# Patient Record
Sex: Male | Born: 1950
Health system: Southern US, Community
[De-identification: ages and names within clinical notes are randomized; demographics above are authoritative.]

## PROBLEM LIST (undated history)

## (undated) DIAGNOSIS — S46219A Strain of muscle, fascia and tendon of other parts of biceps, unspecified arm, initial encounter: Secondary | ICD-10-CM

## (undated) DIAGNOSIS — M2042 Other hammer toe(s) (acquired), left foot: Secondary | ICD-10-CM

## (undated) DIAGNOSIS — K219 Gastro-esophageal reflux disease without esophagitis: Secondary | ICD-10-CM

## (undated) DIAGNOSIS — F419 Anxiety disorder, unspecified: Secondary | ICD-10-CM

## (undated) DIAGNOSIS — I10 Essential (primary) hypertension: Secondary | ICD-10-CM

## (undated) HISTORY — PX: TONSILLECTOMY AND ADENOIDECTOMY: SUR1326

## (undated) HISTORY — PX: OTHER SURGICAL HISTORY: SHX169

---

## 1998-03-29 ENCOUNTER — Emergency Department (HOSPITAL_COMMUNITY): Admission: EM | Admit: 1998-03-29 | Discharge: 1998-03-29 | Payer: Self-pay | Admitting: Emergency Medicine

## 1998-03-31 ENCOUNTER — Encounter: Admission: RE | Admit: 1998-03-31 | Discharge: 1998-06-29 | Payer: Self-pay | Admitting: Internal Medicine

## 1998-08-09 ENCOUNTER — Emergency Department (HOSPITAL_COMMUNITY): Admission: EM | Admit: 1998-08-09 | Discharge: 1998-08-09 | Payer: Self-pay | Admitting: Emergency Medicine

## 2001-12-31 ENCOUNTER — Encounter: Admission: RE | Admit: 2001-12-31 | Discharge: 2001-12-31 | Payer: Self-pay | Admitting: Family Medicine

## 2001-12-31 ENCOUNTER — Encounter: Payer: Self-pay | Admitting: Family Medicine

## 2004-02-15 ENCOUNTER — Emergency Department (HOSPITAL_COMMUNITY): Admission: EM | Admit: 2004-02-15 | Discharge: 2004-02-15 | Payer: Self-pay | Admitting: Emergency Medicine

## 2009-02-11 ENCOUNTER — Emergency Department (HOSPITAL_COMMUNITY): Admission: EM | Admit: 2009-02-11 | Discharge: 2009-02-12 | Payer: Self-pay | Admitting: Emergency Medicine

## 2009-02-17 ENCOUNTER — Encounter: Admission: RE | Admit: 2009-02-17 | Discharge: 2009-02-17 | Payer: Self-pay | Admitting: Family Medicine

## 2009-11-29 ENCOUNTER — Emergency Department (HOSPITAL_COMMUNITY): Admission: EM | Admit: 2009-11-29 | Discharge: 2009-11-29 | Payer: Self-pay | Admitting: Emergency Medicine

## 2009-12-08 ENCOUNTER — Ambulatory Visit (HOSPITAL_BASED_OUTPATIENT_CLINIC_OR_DEPARTMENT_OTHER): Admission: RE | Admit: 2009-12-08 | Discharge: 2009-12-08 | Payer: Self-pay | Admitting: Unknown Physician Specialty

## 2009-12-08 HISTORY — PX: ORIF ANKLE FRACTURE: SUR919

## 2010-11-22 LAB — BASIC METABOLIC PANEL
BUN: 20 mg/dL (ref 6–23)
CO2: 28 mEq/L (ref 19–32)
Calcium: 9.2 mg/dL (ref 8.4–10.5)
Chloride: 106 mEq/L (ref 96–112)
Creatinine, Ser: 1.01 mg/dL (ref 0.4–1.5)
GFR calc Af Amer: >60 mL/min (ref >60)
GFR calc non Af Amer: >60 mL/min (ref >60)
Glucose, Bld: 109 mg/dL — ABNORMAL HIGH (ref 70–99)
Potassium: 4.5 mEq/L (ref 3.5–5.1)
Sodium: 137 mEq/L (ref 135–145)

## 2010-11-22 LAB — POCT HEMOGLOBIN-HEMACUE: Hemoglobin: 14.8 g/dL (ref 13.0–17.0)

## 2010-12-11 LAB — URINALYSIS, ROUTINE W REFLEX MICROSCOPIC
Bilirubin Urine: NEGATIVE
Ketones, ur: 15 mg/dL — AB
Nitrite: NEGATIVE
pH: 7 (ref 5.0–8.0)

## 2010-12-11 LAB — CBC
HCT: 43.4 % (ref 39.0–52.0)
Hemoglobin: 14.9 g/dL (ref 13.0–17.0)
MCHC: 34.3 g/dL (ref 30.0–36.0)
MCV: 88.6 fL (ref 78.0–100.0)
RDW: 13.6 % (ref 11.5–15.5)

## 2010-12-11 LAB — BASIC METABOLIC PANEL
BUN: 21 mg/dL (ref 6–23)
CO2: 25 mEq/L (ref 19–32)
Calcium: 8.7 mg/dL (ref 8.4–10.5)
Chloride: 106 mEq/L (ref 96–112)
Creatinine, Ser: 1.1 mg/dL (ref 0.4–1.5)
GFR calc Af Amer: >60 mL/min (ref >60)
GFR calc non Af Amer: >60 mL/min (ref >60)
Glucose, Bld: 133 mg/dL — ABNORMAL HIGH (ref 70–99)
Potassium: 4.4 mEq/L (ref 3.5–5.1)
Sodium: 140 mEq/L (ref 135–145)

## 2010-12-11 LAB — DIFFERENTIAL
Basophils Absolute: 0 10*3/uL (ref 0.0–0.1)
Basophils Relative: 0 % (ref 0–1)
Eosinophils Absolute: 0 10*3/uL (ref 0.0–0.7)
Eosinophils Relative: 0 % (ref 0–5)
Lymphocytes Relative: 7 % — ABNORMAL LOW (ref 12–46)
Monocytes Absolute: 0.5 10*3/uL (ref 0.1–1.0)

## 2011-07-22 ENCOUNTER — Emergency Department (HOSPITAL_COMMUNITY)
Admission: EM | Admit: 2011-07-22 | Discharge: 2011-07-22 | Disposition: A | Discharge status: Home / Self Care | Payer: Managed Care, Other (non HMO) | Attending: Emergency Medicine | Admitting: Emergency Medicine

## 2011-07-22 ENCOUNTER — Emergency Department (HOSPITAL_COMMUNITY): Payer: Managed Care, Other (non HMO)

## 2011-07-22 DIAGNOSIS — I10 Essential (primary) hypertension: Secondary | ICD-10-CM | POA: Insufficient documentation

## 2011-07-22 DIAGNOSIS — IMO0002 Reserved for concepts with insufficient information to code with codable children: Secondary | ICD-10-CM | POA: Insufficient documentation

## 2011-07-22 DIAGNOSIS — X500XXA Overexertion from strenuous movement or load, initial encounter: Secondary | ICD-10-CM | POA: Insufficient documentation

## 2011-07-22 DIAGNOSIS — M79609 Pain in unspecified limb: Secondary | ICD-10-CM | POA: Insufficient documentation

## 2011-07-22 DIAGNOSIS — S63509A Unspecified sprain of unspecified wrist, initial encounter: Secondary | ICD-10-CM

## 2011-07-22 HISTORY — DX: Essential (primary) hypertension: I10

## 2011-07-22 MED ORDER — OXYCODONE-ACETAMINOPHEN 5-325 MG PO TABS: 1.0000 | ORAL_TABLET | Freq: Four times a day (QID) | ORAL | Status: AC | PRN

## 2011-07-22 MED ORDER — NAPROXEN 500 MG PO TABS: 500.0000 mg | ORAL_TABLET | Freq: Two times a day (BID) | ORAL | Status: DC

## 2011-07-22 NOTE — ED Notes (Signed)
Pt amb indep out of facility.

## 2011-07-22 NOTE — ED Provider Notes (Signed)
History     CSN: [id number] Arrival date & time: 07/22/2011  7:19 AM   First MD Initiated Contact with Patient 07/22/11 0745      Chief Complaint  Patient presents with  . Arm Pain    pt in this am with pain to the left arm states while lifting something last night felt a "pop" in the left arm states pain has increased this am states pain radiaites to the wrist no swelling noted pulses strong     (Consider location/radiation/quality/duration/timing/severity/associated sxs/prior treatment) HPI Comments: Patient states he was lifting a heavy table and felt a "pop" in the left arm. States he's had constant pain since.  Patient is a 60 y.o. male presenting with arm pain. The history is provided by the patient. No language interpreter was used.  Arm Pain This is a new problem. The current episode started yesterday. The problem occurs constantly. The problem has not changed since onset.Pertinent negatives include no chest pain, no abdominal pain, no headaches and no shortness of breath. The symptoms are aggravated by bending. The symptoms are relieved by nothing. He has tried acetaminophen, rest and a cold compress (bengay and nsaids) for the symptoms. The treatment provided no relief.    Past Medical History  Diagnosis Date  . Hypertension     Past Surgical History  Procedure Date  . Ankle fracture surgery     History reviewed. No pertinent family history.  History  Substance Use Topics  . Smoking status: Never Smoker   . Smokeless tobacco: Not on file  . Alcohol Use: No      Review of Systems  Constitutional: Negative for fever, activity change and fatigue.  HENT: Negative for congestion, sore throat, rhinorrhea and neck pain.   Respiratory: Negative for cough and shortness of breath.   Cardiovascular: Negative for chest pain and palpitations.  Gastrointestinal: Negative for nausea, vomiting and abdominal pain.  Genitourinary: Negative for dysuria, urgency, frequency and  flank pain.  Musculoskeletal: Positive for arthralgias. Negative for back pain and joint swelling.  Neurological: Negative for dizziness, weakness, light-headedness and headaches.  All other systems reviewed and are negative.    Allergies  Review of patient's allergies indicates no known allergies.  Home Medications   Current Outpatient Rx  Name Route Sig Dispense Refill  . VITAMIN D 1000 UNITS PO TABS Oral Take 1,000 Units by mouth daily.      Marland Kitchen DILTIAZEM HCL COATED BEADS 120 MG PO CP24 Oral Take 120 mg by mouth daily.      Carma Leaven M PLUS PO TABS Oral Take 1 tablet by mouth daily.      . QUINAPRIL HCL 40 MG PO TABS Oral Take 40 mg by mouth daily.      Marland Kitchen NAPROXEN 500 MG PO TABS Oral Take 1 tablet (500 mg total) by mouth 2 (two) times daily. 30 tablet 0  . OXYCODONE-ACETAMINOPHEN 5-325 MG PO TABS Oral Take 1-2 tablets by mouth every 6 (six) hours as needed for pain. 20 tablet 0    BP 203/87  Pulse 70  Temp(Src) 97.7 F (36.5 C) (Oral)  Resp 16  SpO2 100%  Physical Exam  Nursing note and vitals reviewed. Constitutional: He is oriented to person, place, and time. He appears well-developed and well-nourished. No distress.  HENT:  Head: Normocephalic and atraumatic.  Mouth/Throat: Oropharynx is clear and moist.  Eyes: Conjunctivae and EOM are normal. Pupils are equal, round, and reactive to light.  Neck: Normal range of motion. Neck  supple.  Cardiovascular: Normal rate, regular rhythm, normal heart sounds and intact distal pulses.   Pulmonary/Chest: Effort normal and breath sounds normal. No respiratory distress.  Abdominal: Soft. Bowel sounds are normal. There is no tenderness.  Musculoskeletal: He exhibits tenderness.       Limited supination at the left elbow. He has full range of motion with flexion and extension at the left elbow. He has pain on palpation over both the proximal ulna and proximal radius. There is no obvious deformity. Sensation is intact distally. Motor is  intact distally in all nerve distributions.  Neurological: He is alert and oriented to person, place, and time. No cranial nerve deficit.  Skin: Skin is warm and dry.    ED Course  Procedures (including critical care time)  Labs Reviewed - No data to display Dg Elbow Complete Left  07/22/2011  *RADIOLOGY REPORT*  Clinical Data: Proximal left forearm pain  LEFT ELBOW - COMPLETE 3+ VIEW  Comparison: None.  Findings: No fracture or dislocation is seen.  The joint spaces are preserved.  The visualized soft tissues are unremarkable.  No displaced elbow joint fat pads to suggest an elbow joint effusion.  IMPRESSION: Normal elbow radiographs.  Original Report Authenticated By: Charline Bills, M.D.   Dg Forearm Left  07/22/2011  *RADIOLOGY REPORT*  Clinical Data: Proximal left forearm pain  LEFT FOREARM - 2 VIEW  Comparison: None.  Findings: No fracture or dislocation is seen.  The joint spaces are preserved.  The visualized soft tissues are unremarkable.  IMPRESSION: Normal forearm radiographs.  Original Report Authenticated By: Charline Bills, M.D.     1. Forearm sprain       MDM  Patient's injury is consistent with a form sprain. He'll be placed in a sling. I administered her pain medication emergency department because he drove himself here. He did not take his blood pressure medications this morning and I feel his elevated blood pressure likely has a component secondary to pain. The patient is a very established with orthopedics. I instructed him to followup in one week for reevaluation.        Dayton Bailiff, MD 07/22/11 [phone]

## 2011-08-03 ENCOUNTER — Other Ambulatory Visit: Payer: Self-pay

## 2011-08-03 ENCOUNTER — Encounter (HOSPITAL_BASED_OUTPATIENT_CLINIC_OR_DEPARTMENT_OTHER): Payer: Self-pay | Admitting: *Deleted

## 2011-08-03 ENCOUNTER — Encounter (HOSPITAL_BASED_OUTPATIENT_CLINIC_OR_DEPARTMENT_OTHER)
Admission: RE | Admit: 2011-08-03 | Discharge: 2011-08-03 | Disposition: A | Discharge status: Home / Self Care | Payer: Managed Care, Other (non HMO) | Source: Ambulatory Visit | Attending: Orthopedic Surgery | Admitting: Orthopedic Surgery

## 2011-08-03 LAB — BASIC METABOLIC PANEL
BUN: 21 mg/dL (ref 6–23)
Chloride: 103 mEq/L (ref 96–112)
Creatinine, Ser: 0.87 mg/dL (ref 0.50–1.35)
GFR calc Af Amer: >90 mL/min (ref >90)

## 2011-08-03 NOTE — Pre-Procedure Instructions (Signed)
To come for BMET and EKG 

## 2011-08-06 ENCOUNTER — Ambulatory Visit (HOSPITAL_BASED_OUTPATIENT_CLINIC_OR_DEPARTMENT_OTHER): Payer: Managed Care, Other (non HMO) | Admitting: Anesthesiology

## 2011-08-06 ENCOUNTER — Encounter (HOSPITAL_BASED_OUTPATIENT_CLINIC_OR_DEPARTMENT_OTHER): Payer: Self-pay | Admitting: Anesthesiology

## 2011-08-06 ENCOUNTER — Ambulatory Visit (HOSPITAL_BASED_OUTPATIENT_CLINIC_OR_DEPARTMENT_OTHER)
Admission: RE | Admit: 2011-08-06 | Discharge: 2011-08-06 | Disposition: A | Discharge status: Home / Self Care | Payer: Managed Care, Other (non HMO) | Source: Ambulatory Visit | Attending: Orthopedic Surgery | Admitting: Orthopedic Surgery

## 2011-08-06 ENCOUNTER — Encounter (HOSPITAL_BASED_OUTPATIENT_CLINIC_OR_DEPARTMENT_OTHER)
Admission: RE | Disposition: A | Discharge status: Home / Self Care | Payer: Self-pay | Source: Ambulatory Visit | Attending: Orthopedic Surgery

## 2011-08-06 ENCOUNTER — Encounter (HOSPITAL_BASED_OUTPATIENT_CLINIC_OR_DEPARTMENT_OTHER): Payer: Self-pay | Admitting: Certified Registered Nurse Anesthetist

## 2011-08-06 ENCOUNTER — Encounter (HOSPITAL_BASED_OUTPATIENT_CLINIC_OR_DEPARTMENT_OTHER): Payer: Self-pay

## 2011-08-06 DIAGNOSIS — S43499A Other sprain of unspecified shoulder joint, initial encounter: Secondary | ICD-10-CM | POA: Insufficient documentation

## 2011-08-06 DIAGNOSIS — Z01812 Encounter for preprocedural laboratory examination: Secondary | ICD-10-CM | POA: Insufficient documentation

## 2011-08-06 DIAGNOSIS — X500XXA Overexertion from strenuous movement or load, initial encounter: Secondary | ICD-10-CM | POA: Insufficient documentation

## 2011-08-06 DIAGNOSIS — I10 Essential (primary) hypertension: Secondary | ICD-10-CM | POA: Insufficient documentation

## 2011-08-06 DIAGNOSIS — S46819A Strain of other muscles, fascia and tendons at shoulder and upper arm level, unspecified arm, initial encounter: Secondary | ICD-10-CM | POA: Insufficient documentation

## 2011-08-06 DIAGNOSIS — Z01810 Encounter for preprocedural cardiovascular examination: Secondary | ICD-10-CM | POA: Insufficient documentation

## 2011-08-06 DIAGNOSIS — Y998 Other external cause status: Secondary | ICD-10-CM | POA: Insufficient documentation

## 2011-08-06 HISTORY — DX: Strain of muscle, fascia and tendon of other parts of biceps, unspecified arm, initial encounter: S46.219A

## 2011-08-06 HISTORY — PX: DISTAL BICEPS TENDON REPAIR: SHX1461

## 2011-08-06 SURGERY — REPAIR, TENDON, BICEPS, DISTAL
Anesthesia: General | Site: Arm Upper | Laterality: Left | Wound class: Clean

## 2011-08-06 MED ORDER — ONDANSETRON HCL 4 MG/2ML IJ SOLN
INTRAMUSCULAR | Status: DC | PRN
  Administered 2011-08-06: 4 mg via INTRAVENOUS

## 2011-08-06 MED ORDER — MIDAZOLAM HCL 2 MG/2ML IJ SOLN
0.5000 mg | INTRAMUSCULAR | Status: DC | PRN
  Administered 2011-08-06: 2 mg via INTRAVENOUS

## 2011-08-06 MED ORDER — LIDOCAINE HCL (CARDIAC) 20 MG/ML IV SOLN
INTRAVENOUS | Status: DC | PRN
  Administered 2011-08-06: 40 mg via INTRAVENOUS

## 2011-08-06 MED ORDER — PROPOFOL 10 MG/ML IV EMUL
INTRAVENOUS | Status: DC | PRN
  Administered 2011-08-06: 150 mg via INTRAVENOUS

## 2011-08-06 MED ORDER — CEFAZOLIN SODIUM 1-5 GM-% IV SOLN
1.0000 g | Freq: Once | INTRAVENOUS | Status: AC
  Administered 2011-08-06: 2 g via INTRAVENOUS

## 2011-08-06 MED ORDER — INDOMETHACIN 75 MG PO CPCR: 1.0000 | ORAL_CAPSULE | Freq: Two times a day (BID) | ORAL | Status: DC

## 2011-08-06 MED ORDER — EPHEDRINE SULFATE 50 MG/ML IJ SOLN
INTRAMUSCULAR | Status: DC | PRN
  Administered 2011-08-06: 10 mg via INTRAVENOUS

## 2011-08-06 MED ORDER — LACTATED RINGERS IV SOLN
INTRAVENOUS | Status: DC
  Administered 2011-08-06 (×2): via INTRAVENOUS

## 2011-08-06 MED ORDER — HYDROMORPHONE HCL 2 MG PO TABS: ORAL_TABLET | ORAL | Status: DC

## 2011-08-06 MED ORDER — BUPIVACAINE-EPINEPHRINE PF 0.5-1:200000 % IJ SOLN
INTRAMUSCULAR | Status: DC | PRN
  Administered 2011-08-06: 40 mL

## 2011-08-06 MED ORDER — FENTANYL CITRATE 0.05 MG/ML IJ SOLN
50.0000 ug | INTRAMUSCULAR | Status: DC | PRN
  Administered 2011-08-06: 100 ug via INTRAVENOUS

## 2011-08-06 MED ORDER — DROPERIDOL 2.5 MG/ML IJ SOLN
INTRAMUSCULAR | Status: DC | PRN
  Administered 2011-08-06: 0.625 mg via INTRAVENOUS

## 2011-08-06 MED ORDER — DEXAMETHASONE SODIUM PHOSPHATE 10 MG/ML IJ SOLN
INTRAMUSCULAR | Status: DC | PRN
  Administered 2011-08-06: 10 mg via INTRAVENOUS

## 2011-08-06 SURGICAL SUPPLY — 77 items
APL SKNCLS STERI-STRIP NONHPOA (GAUZE/BANDAGES/DRESSINGS) ×1
BANDAGE ACE 4 STERILE (GAUZE/BANDAGES/DRESSINGS) ×2 IMPLANT
BANDAGE ELASTIC 4 VELCRO ST LF (GAUZE/BANDAGES/DRESSINGS) ×4 IMPLANT
BENZOIN TINCTURE PRP APPL 2/3 (GAUZE/BANDAGES/DRESSINGS) ×2 IMPLANT
BLADE SURG 15 STRL LF DISP TIS (BLADE) ×1 IMPLANT
BLADE SURG 15 STRL SS (BLADE) ×2
BNDG ESMARK 4X9 LF (GAUZE/BANDAGES/DRESSINGS) IMPLANT
BUR EGG/OVAL CARBIDE (BURR) IMPLANT
CANISTER SUCTION 1200CC (MISCELLANEOUS) ×2 IMPLANT
CHLORAPREP W/TINT 26ML (MISCELLANEOUS) ×2 IMPLANT
CLOSURE STERI STRIP 1/2 X4 (GAUZE/BANDAGES/DRESSINGS) ×2 IMPLANT
CLOTH BEACON ORANGE TIMEOUT ST (SAFETY) ×2 IMPLANT
CORDS BIPOLAR (ELECTRODE) ×2 IMPLANT
COVER MAYO STAND STRL (DRAPES) ×2 IMPLANT
COVER TABLE BACK 60X90 (DRAPES) ×2 IMPLANT
DECANTER SPIKE VIAL GLASS SM (MISCELLANEOUS) IMPLANT
DRAPE EXTREMITY T 121X128X90 (DRAPE) ×2 IMPLANT
DRAPE OEC MINIVIEW 54X84 (DRAPES) ×2 IMPLANT
DRAPE SURG 17X23 STRL (DRAPES) IMPLANT
DRAPE U 20/CS (DRAPES) ×2 IMPLANT
DRAPE U-SHAPE 47X51 STRL (DRAPES) ×2 IMPLANT
ELECT REM PT RETURN 9FT ADLT (ELECTROSURGICAL) ×2
ELECTRODE REM PT RTRN 9FT ADLT (ELECTROSURGICAL) ×1 IMPLANT
GAUZE XEROFORM 1X8 LF (GAUZE/BANDAGES/DRESSINGS) ×2 IMPLANT
GLOVE BIO SURGEON STRL SZ7.5 (GLOVE) ×4 IMPLANT
GLOVE BIOGEL M 7.0 STRL (GLOVE) ×2 IMPLANT
GLOVE BIOGEL M STRL SZ7.5 (GLOVE) ×2 IMPLANT
GLOVE BIOGEL PI IND STRL 7.5 (GLOVE) ×1 IMPLANT
GLOVE BIOGEL PI IND STRL 8 (GLOVE) ×1 IMPLANT
GLOVE BIOGEL PI INDICATOR 7.5 (GLOVE) ×1
GLOVE BIOGEL PI INDICATOR 8 (GLOVE) ×1
GOWN PREVENTION PLUS XLARGE (GOWN DISPOSABLE) IMPLANT
GOWN PREVENTION PLUS XXLARGE (GOWN DISPOSABLE) ×6 IMPLANT
IMPL TOGGLELOC ELBOW SYSTEM (Orthopedic Implant) ×1 IMPLANT
IMPLANT TOGGLELOC ELBOW SYSTEM (Orthopedic Implant) ×2 IMPLANT
NEEDLE HYPO 25X1 1.5 SAFETY (NEEDLE) IMPLANT
NS IRRIG 1000ML POUR BTL (IV SOLUTION) ×2 IMPLANT
PACK BASIN DAY SURGERY FS (CUSTOM PROCEDURE TRAY) ×2 IMPLANT
PAD CAST 4YDX4 CTTN HI CHSV (CAST SUPPLIES) ×2 IMPLANT
PADDING CAST ABS 4INX4YD NS (CAST SUPPLIES)
PADDING CAST ABS COTTON 4X4 ST (CAST SUPPLIES) IMPLANT
PADDING CAST COTTON 4X4 STRL (CAST SUPPLIES) ×4
PADDING WEBRIL 4 STERILE (GAUZE/BANDAGES/DRESSINGS) ×2 IMPLANT
PENCIL BUTTON HOLSTER BLD 10FT (ELECTRODE) ×2 IMPLANT
SLING ARM FOAM STRAP LRG (SOFTGOODS) ×2 IMPLANT
SPLINT FAST PLASTER 5X30 (CAST SUPPLIES) ×11
SPLINT PLASTER CAST FAST 5X30 (CAST SUPPLIES) ×11 IMPLANT
SPLINT PLASTER CAST XFAST 4X15 (CAST SUPPLIES) IMPLANT
SPLINT PLASTER XTRA FAST SET 4 (CAST SUPPLIES)
SPONGE GAUZE 4X4 12PLY (GAUZE/BANDAGES/DRESSINGS) ×2 IMPLANT
SPONGE LAP 4X18 X RAY DECT (DISPOSABLE) ×2 IMPLANT
STOCKINETTE 4X48 STRL (DRAPES) ×2 IMPLANT
STRIP CLOSURE SKIN 1/2X4 (GAUZE/BANDAGES/DRESSINGS) ×2 IMPLANT
SUCTION FRAZIER TIP 10 FR DISP (SUCTIONS) ×2 IMPLANT
SUT 2 FIBERLOOP 20 STRT BLUE (SUTURE) ×4
SUT ETHIBOND 0 MO6 C/R (SUTURE) IMPLANT
SUT ETHIBOND 5 LR DA (SUTURE) IMPLANT
SUT FIBERWIRE #2 38 T-5 BLUE (SUTURE) ×2
SUT MNCRL AB 4-0 PS2 18 (SUTURE) ×2 IMPLANT
SUT SILK 3 0 TIES 17X18 (SUTURE) ×1
SUT SILK 3-0 18XBRD TIE BLK (SUTURE) ×1 IMPLANT
SUT VIC AB 0 CT1 27 (SUTURE)
SUT VIC AB 0 CT1 27XBRD ANBCTR (SUTURE) IMPLANT
SUT VIC AB 2-0 SH 27 (SUTURE)
SUT VIC AB 2-0 SH 27XBRD (SUTURE) IMPLANT
SUT VIC AB 3-0 SH 27 (SUTURE) ×1
SUT VIC AB 3-0 SH 27X BRD (SUTURE) ×1 IMPLANT
SUT VICRYL AB 3 0 TIES (SUTURE) IMPLANT
SUTURE 2 FIBERLOOP 20 STRT BLU (SUTURE) ×2 IMPLANT
SUTURE FIBERWR #2 38 T-5 BLUE (SUTURE) ×1 IMPLANT
SYR BULB 3OZ (MISCELLANEOUS) ×2 IMPLANT
SYR CONTROL 10ML LL (SYRINGE) IMPLANT
TOWEL OR 17X24 6PK STRL BLUE (TOWEL DISPOSABLE) ×2 IMPLANT
TOWEL OR NON WOVEN STRL DISP B (DISPOSABLE) ×2 IMPLANT
TUBE CONNECTING 20X1/4 (TUBING) ×4 IMPLANT
UNDERPAD 30X30 INCONTINENT (UNDERPADS AND DIAPERS) ×2 IMPLANT
WATER STERILE IRR 1000ML POUR (IV SOLUTION) IMPLANT

## 2011-08-06 NOTE — Anesthesia Postprocedure Evaluation (Signed)
  Anesthesia Post-op Note  Patient: Cesar Wilson  Procedure(s) Performed:  DISTAL BICEPS TENDON REPAIR  Patient Location: PACU  Anesthesia Type: GA combined with regional for post-op pain  Level of Consciousness: awake and alert   Airway and Oxygen Therapy: Patient Spontanous Breathing and Patient connected to face mask oxygen  Post-op Pain: none  Post-op Assessment: Post-op Vital signs reviewed, Patient's Cardiovascular Status Stable, Respiratory Function Stable, Patent Airway and No signs of Nausea or vomiting  Post-op Vital Signs: Reviewed and stable  Complications: No apparent anesthesia complications

## 2011-08-06 NOTE — H&P (Signed)
Cesar Wilson is an 60 y.o. male.   Chief Complaint: L distal biceps rupture HPI: L distal biceps rupture  Past Medical History  Diagnosis Date  . Hypertension     under control; has been on med. x 23 yrs.  . Biceps tendon rupture     left distal    Past Surgical History  Procedure Date  . Tonsillectomy and adenoidectomy as a child  . Orif ankle fracture 12/08/2009    left lat. malleolus    History reviewed. No pertinent family history. Social History:  reports that he has quit smoking. He has never used smokeless tobacco. He reports that he does not drink alcohol or use illicit drugs.  Allergies:  Allergies  Allergen Reactions  . Percocet (Oxycodone-Acetaminophen) Other (See Comments)    Keeps him awake, and does not relieve pain well    Medications Prior to Admission  Medication Dose Route Frequency Provider Last Rate Last Dose  . fentaNYL (SUBLIMAZE) injection 50-100 mcg  50-100 mcg Intravenous PRN Zenon Mayo, MD   100 mcg at 08/06/11 1318  . lactated ringers infusion   Intravenous Continuous Remonia Richter, MD 20 mL/hr at 08/06/11 1300    . midazolam (VERSED) injection 0.5-2 mg  0.5-2 mg Intravenous PRN Zenon Mayo, MD   2 mg at 08/06/11 1318   Medications Prior to Admission  Medication Sig Dispense Refill  . cholecalciferol (VITAMIN D) 1000 UNITS tablet Take 1,000 Units by mouth daily. Vit. D3      . diltiazem (CARDIZEM CD) 120 MG 24 hr capsule Take 120 mg by mouth daily. AM      . Multiple Vitamins-Minerals (MULTIVITAMINS THER. W/MINERALS) TABS Take 1 tablet by mouth daily.       Marland Kitchen oxyCODONE-acetaminophen (PERCOCET) 5-325 MG per tablet Take 1 tablet by mouth every 6 (six) hours as needed.        . quinapril (ACCUPRIL) 40 MG tablet Take 40 mg by mouth daily. AM      . oxyCODONE-acetaminophen (PERCOCET) 5-325 MG per tablet Take 1-2 tablets by mouth every 6 (six) hours as needed for pain.  20 tablet  0    No results found for this or any  previous visit (from the past 48 hour(s)). No results found.  Review of Systems  All other systems reviewed and are negative.    Blood pressure 140/71, pulse 81, temperature 97.9 F (36.6 C), temperature source Oral, resp. rate 11, height 5' 10.5" (1.791 m), weight 81.647 kg (180 lb), SpO2 100.00%. Physical Exam  Constitutional: He is oriented to person, place, and time. He appears well-developed and well-nourished.  HENT:  Head: Atraumatic.  Eyes: EOM are normal.  Neck: Neck supple.  Cardiovascular: Intact distal pulses.   Respiratory: Effort normal.  GI: Soft.  Musculoskeletal: Normal range of motion.  Neurological: He is alert and oriented to person, place, and time.  Skin: Skin is warm.  Psychiatric: He has a normal mood and affect.     Assessment/Plan L distal biceps rupture Risks / benefits of surgery discussed Consent on chart  NPO for OR Preop antibiotics     Rashad Auld WILLIAM 08/06/2011, 2:15 PM

## 2011-08-06 NOTE — Brief Op Note (Signed)
08/06/2011  4:04 PM  PATIENT:  Cesar Wilson  60 y.o. male  PRE-OPERATIVE DIAGNOSIS:  L distal biceps rupture left  POST-OPERATIVE DIAGNOSIS:  L distal biceps rupture left  PROCEDURE:  Procedure(s): L DISTAL BICEPS TENDON REPAIR  SURGEON:  Surgeon(s): Mable Paris, MD  PHYSICIAN ASSISTANT: Skip Mayer PA-C  ASSISTANTS: none   ANESTHESIA:   regional and general  EBL:  Total I/O In: 1000 [I.V.:1000] Out: -   BLOOD ADMINISTERED:none  DRAINS: none   LOCAL MEDICATIONS USED:  NONE  SPECIMEN:  No Specimen  DISPOSITION OF SPECIMEN:  N/A  COUNTS:  YES  TOURNIQUET:  * No tourniquets in log *  DICTATION: .Other Dictation: Dictation Number did not record  PLAN OF CARE: Discharge to home after PACU  PATIENT DISPOSITION:  PACU - hemodynamically stable.   Delay start of Pharmacological VTE agent (>24hrs) due to surgical blood loss or risk of bleeding:  {YES/NO/NOT APPLICABLE:20182

## 2011-08-06 NOTE — Anesthesia Procedure Notes (Addendum)
Anesthesia Regional Block:  Infraclavicular brachial plexus block  Pre-Anesthetic Checklist: ,, timeout performed, Correct Patient, Correct Site, Correct Laterality, Correct Procedure, Correct Position, site marked, Risks and benefits discussed, pre-op evaluation, post-op pain management  Laterality: Left  Prep: Maximum Sterile Barrier Precautions used and Betadine       Needles:  Injection technique: Single-shot  Needle Type: Other   (Arrow 90mm)    Needle Gauge: 22 and 22 G    Additional Needles:  Procedures: nerve stimulator Infraclavicular brachial plexus block  Nerve Stimulator or Paresthesia:  Response: Posterior cord, 0.4 mA,  Response: Lateral cord, 0.4 mA,   Additional Responses:   Narrative:  Start time: 08/06/2011 1:15 PM End time: 08/06/2011 1:26 PM Injection made incrementally with aspirations every 5 mL.  Performed by: Personally  Anesthesiologist: Sampson Goon, MD  Additional Notes: 2% Lidocaine skin wheel.   Infraclavicular brachial plexus block Procedure Name: LMA Insertion Date/Time: 08/06/2011 2:46 PM Performed by: Jenia Klepper D Pre-anesthesia Checklist: Patient identified, Emergency Drugs available, Suction available and Patient being monitored Patient Re-evaluated:Patient Re-evaluated prior to inductionOxygen Delivery Method: Circle System Utilized Preoxygenation: Pre-oxygenation with 100% oxygen Intubation Type: IV induction Ventilation: Mask ventilation without difficulty LMA: LMA inserted LMA Size: 4.0 Number of attempts: 1 Placement Confirmation: positive ETCO2 Tube secured with: Tape Dental Injury: Teeth and Oropharynx as per pre-operative assessment

## 2011-08-06 NOTE — Anesthesia Preprocedure Evaluation (Addendum)
Anesthesia Evaluation  Patient identified by MRN, date of birth, ID band Patient awake    Reviewed: Allergy & Precautions, H&P , NPO status , Patient's Chart, lab work & pertinent test results  Airway Mallampati: II TM Distance: >3 FB Neck ROM: full    Dental No notable dental hx. (+) Teeth Intact   Pulmonary neg pulmonary ROS,  clear to auscultation  Pulmonary exam normal       Cardiovascular hypertension, On Medications neg cardio ROS regular Normal    Neuro/Psych Negative Neurological ROS  Negative Psych ROS   GI/Hepatic negative GI ROS, Neg liver ROS,   Endo/Other  Negative Endocrine ROS  Renal/GU negative Renal ROS  Genitourinary negative   Musculoskeletal   Abdominal   Peds  Hematology negative hematology ROS (+)   Anesthesia Other Findings   Reproductive/Obstetrics negative OB ROS                           Anesthesia Physical Anesthesia Plan  ASA: II  Anesthesia Plan: Regional and General   Post-op Pain Management:    Induction: Intravenous  Airway Management Planned: LMA  Additional Equipment:   Intra-op Plan:   Post-operative Plan: Extubation in OR  Informed Consent: I have reviewed the patients History and Physical, chart, labs and discussed the procedure including the risks, benefits and alternatives for the proposed anesthesia with the patient or authorized representative who has indicated his/her understanding and acceptance.     Plan Discussed with: CRNA and Surgeon  Anesthesia Plan Comments:       Anesthesia Quick Evaluation

## 2011-08-06 NOTE — Op Note (Signed)
NAME:  Cesar Wilson, Cesar Wilson NO.:  [id number]  MEDICAL RECORD NO.:  [id number]  LOCATION:                                 FACILITY:  PHYSICIAN:  Jones Broom, MD    DATE OF BIRTH:  June 22, 1951  DATE OF PROCEDURE:  08/06/2011 DATE OF DISCHARGE:                              OPERATIVE REPORT   PREOPERATIVE DIAGNOSIS:  Left distal biceps rupture.  POSTOPERATIVE DIAGNOSIS:  Left distal biceps rupture.  PROCEDURE PERFORMED:  Left distal biceps repair.  ATTENDING SURGEON:  Berline Lopes, MD  ASSISTANT:  Skip Mayer (Blair's assistance was essential in retraction and positioning).  ANESTHESIA:  GETA with preoperative interscalene block.  COMPLICATIONS:  None.  DRAINS:  None.  SPECIMENS:  None.  ESTIMATED BLOOD LOSS:  Minimal.  TOURNIQUET TIME:  None.  INDICATION FOR SURGERY:  The patient is a 60 year old gentleman, who was lifting a heavy item approximately 2 weeks ago and felt a pop in his left forearm with bruising.  He was diagnosed clinically with a distal biceps rupture and was verified with an MRI.  We talked about surgical and nonsurgical options, and he elected to go forward with surgical treatment understanding risks, benefits, alternatives to the procedure including, but not limited, to risk of bleeding, infection, damage to neurovascular structures, including cutaneous nerves, risk of stiffness, heterotopic bone formation, and potential need for future surgery.  He understood and elected to go forward with surgery.  OPERATIVE FINDINGS:  The tendon was easily mobilized and significant tendon length was preserved.  It was repaired back to the bicipital tuberosity using a Biomet ZipLoop device.  At the conclusion of the procedure, I could bring him all the way at the full extension without significant tension on the repair.  PROCEDURE:  The patient was identified in the preoperative holding area where I personally marked out the operative  site after verifying site, side, and procedure with the patient.  He was taken back to the operating room where general anesthesia was induced without complication.  He had a preoperative interscalene block given by the attending anesthesiologist.  The left upper extremity was prepped and draped in the standard sterile fashion.  No tourniquet was used.  The appropriate time-out procedure was carried out and the patient did receive preoperative antibiotics.  An approximately 5 cm incision was made in an oblique fashion about 3 cm distal to the dominant elbow flexion crease.  Dissection was carried down through subcutaneous tissues and superficial veins were carefully retracted.  One crossing branches of the vein had to be ligated using suture ties.  The tract of the biceps tendon was easily found and digitally palpated down to the radial tuberosity, which was devoid of any soft tissue at this point. Proximally, the tendon was retrieved and dissected free of scar tissue. The biceps muscle belly was gently digitally freed from surrounding adhesions.  The distal tendon was then prepared by removing unhealthy- appearing tendon and scar tissue back to a healthy-appearing tendon and then preparing the distal tendon with two #2 FiberWire-type sutures in a running-locking Krackow fashion, which were then passed through the Biomet ZipLoop device, affixing it to the distal end of  the tendon. Attention was then turned distally to the radial tuberosity, which was carefully exposed.  The right-angle retractors were initially used radially to prevent any injury to the PIN nerve.  Once the tuberosity was fully visualized, a small baby Linus Orn was placed subperiosteally and radially, no significant retraction was used and taken care to avoid any potential compression of the PIN nerve.  Once the radial tuberosity was exposed, it was debrided of any residual tendon stump using rongeur and Bovie, and the  guide pin from the Biomet set was advanced bicortically.  Fluoroscopic imaging demonstrated anatomic position of the pin in the radial tuberosity.  A 7-mm reamer was used to ream the pin and followed by the 4.5 mm reamer for the dorsal cortex.  The wound was copiously irrigated with normal saline.  All bone dust was removed. The Beath pin was then advanced through the soft tissues with the arm in supination.  A very small stab incision was made dorsally and the pin was advanced through the skin.  The lead sutures from the Biomet device were then passed dorsally.  Under direct visualization, these were then used to pass the button device through the dorsal cortex with counter pressure, flipping the button just on the dorsal cortex.  Fluoroscopic imaging verified this.  The elbow was then flexed to about 45 degrees and under direct visualization, the ZipLoop device was used to slowly bring the tendon down into the prepared tuberosity.  It was completely dunked into the intramedullary canal and had excellent fixation.  The suture was then cut and tied.  The fluoroscopic imaging verified appropriate position of the button.  The dorsal suture was removed.  The wound was copiously irrigated.  At this point, I tested the tightness and I was able to bring the elbow all way into full extension without undue tension on the repair.  The wound was then closed with 3-0 Vicryl in a deep dermal layer and Steri-Strips for skin closure.  The light dressing was applied as well as a well-padded posterior splint in 90 degrees of flexion and neutral rotation.  No tourniquet was used throughout the procedure.  The patient was then allowed to awaken from general anesthesia, transferred to the stretcher, and taken to recovery room in stable condition.  POSTOPERATIVE PLAN:  He will be discharged home today with his family. He will have Dilaudid for pain control.  He tells me that Percocet has not been helpful  for him in the past.  I will also have Indocin twice daily for about 2 weeks for heterotopic bone prophylaxis.  He will follow up with me in 1 week, at which time we will get him out of the splint and start early motion.     Jones Broom, MD     JC/MEDQ  D:  08/06/2011  T:  08/06/2011  Job:  [id number]

## 2011-08-06 NOTE — Transfer of Care (Signed)
Immediate Anesthesia Transfer of Care Note  Patient: Cesar Wilson  Procedure(s) Performed:  DISTAL BICEPS TENDON REPAIR  Patient Location: PACU  Anesthesia Type: GA combined with regional for post-op pain  Level of Consciousness: awake, alert , oriented and patient cooperative  Airway & Oxygen Therapy: Patient Spontanous Breathing and Patient connected to face mask oxygen  Post-op Assessment: Report given to PACU RN and Post -op Vital signs reviewed and stable  Post vital signs: Reviewed and stable  Complications: No apparent anesthesia complications

## 2011-08-10 ENCOUNTER — Encounter (HOSPITAL_BASED_OUTPATIENT_CLINIC_OR_DEPARTMENT_OTHER): Payer: Self-pay | Admitting: Orthopedic Surgery

## 2012-03-23 IMAGING — CR DG ELBOW COMPLETE 3+V*L*
4 series · 4 of 4 positions shown · non-contrast
Comparison: None.

CLINICAL DATA: Proximal left forearm pain

LEFT ELBOW - COMPLETE 3+ VIEW

[x elbow ap left]
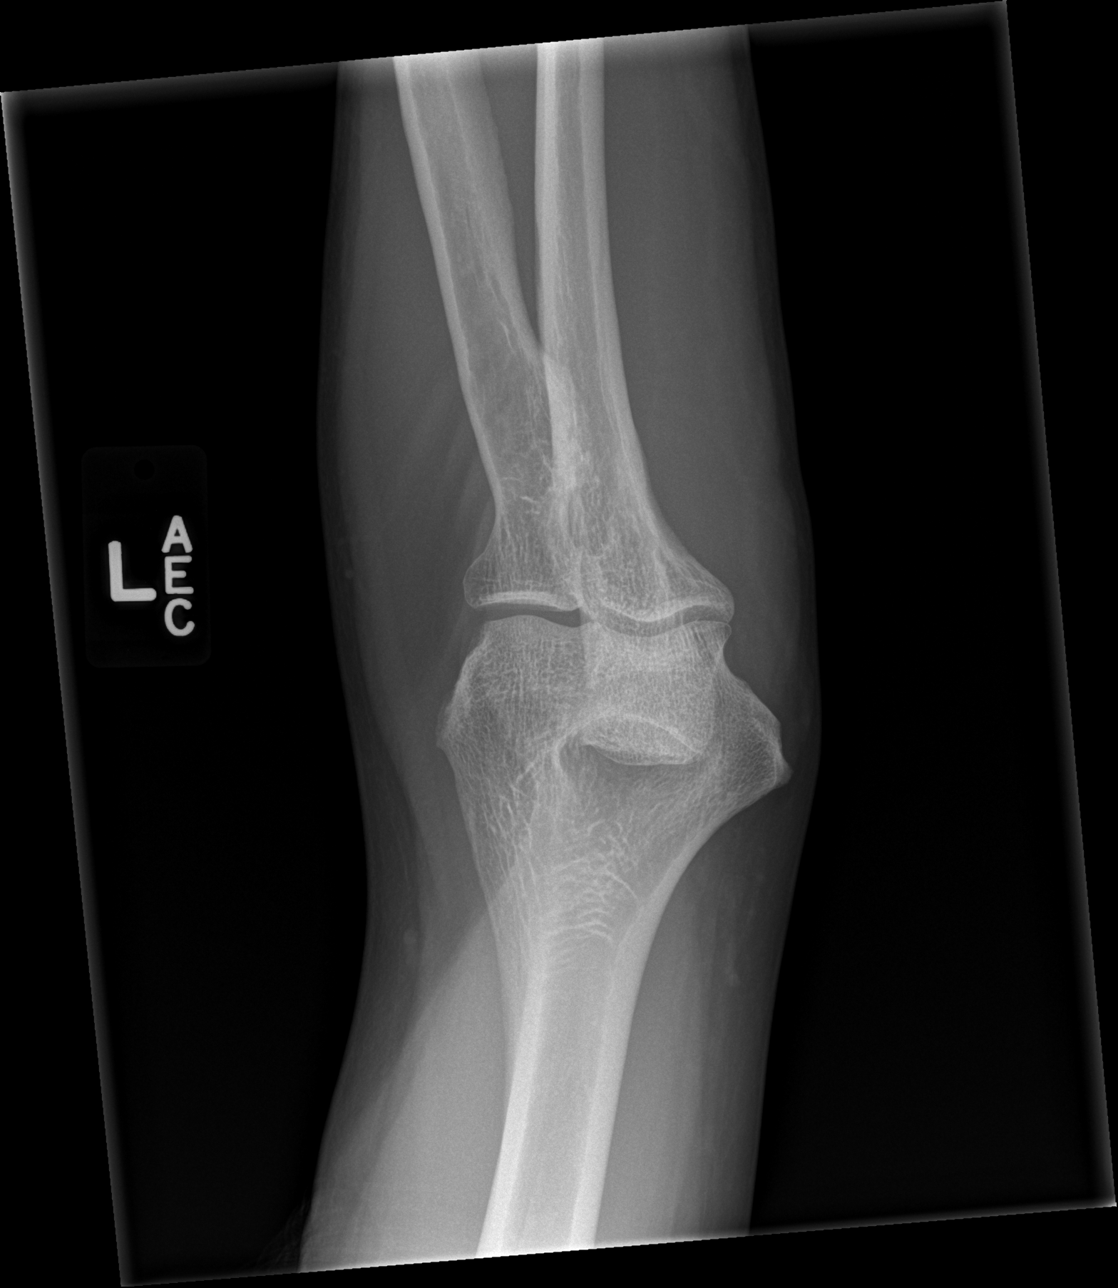

[x elbow obl left (1 of 2)]
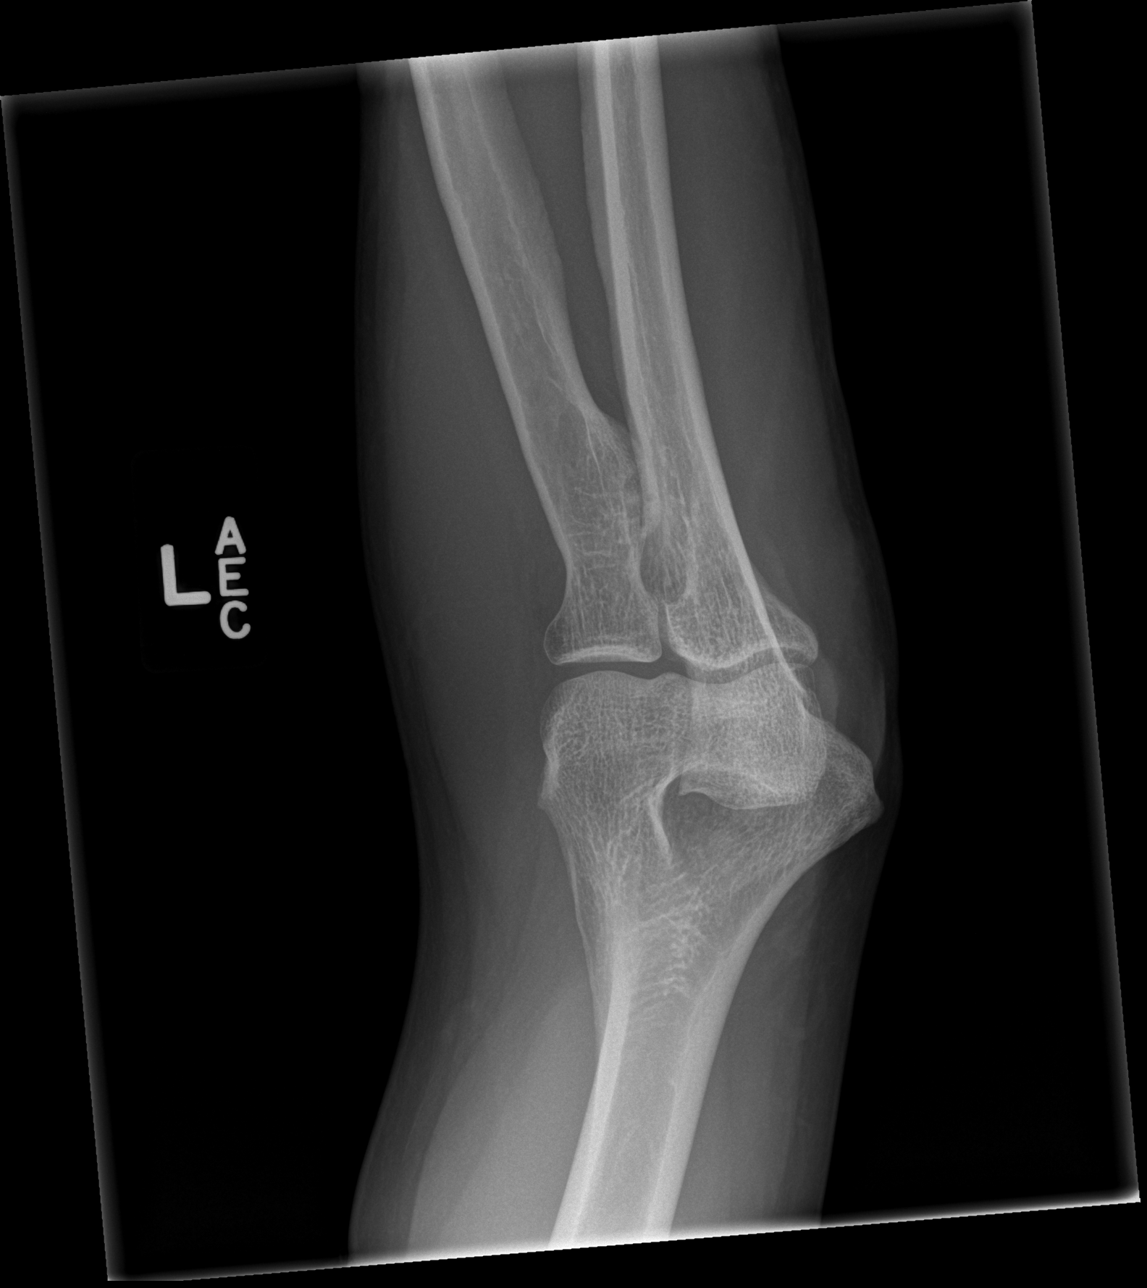

[x elbow obl left (2 of 2)]
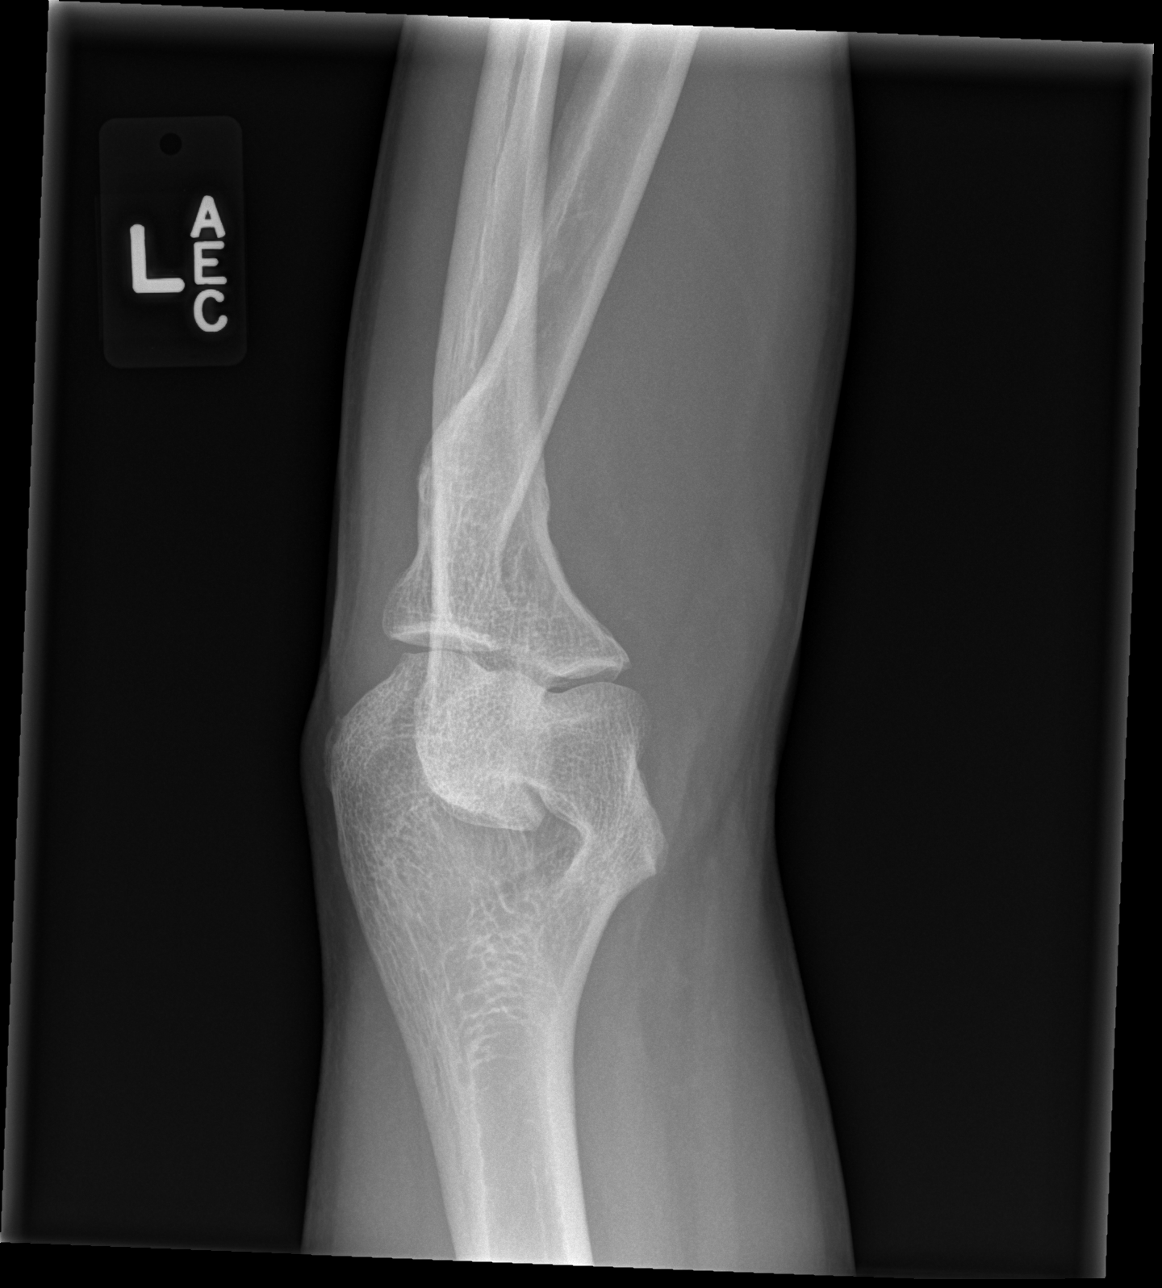

[x elbow lat left]
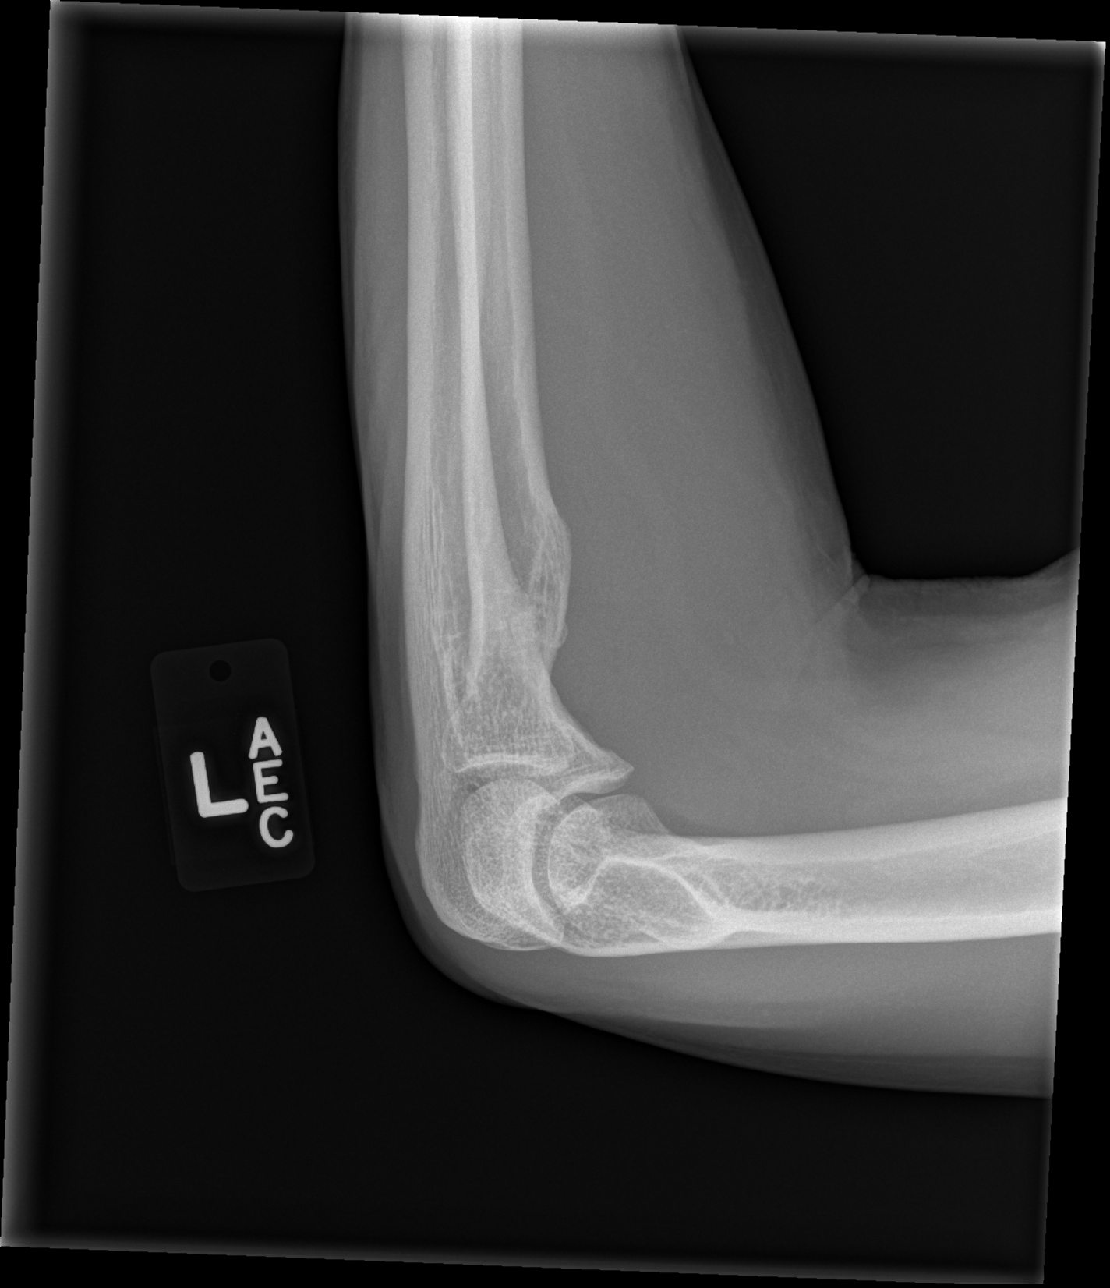

[4 of 4 positions shown; findings below may reference images not displayed]

FINDINGS: No fracture or dislocation is seen.

The joint spaces are preserved.

The visualized soft tissues are unremarkable.  No displaced elbow
joint fat pads to suggest an elbow joint effusion.
IMPRESSION: Normal elbow radiographs.

## 2012-04-09 ENCOUNTER — Encounter (HOSPITAL_BASED_OUTPATIENT_CLINIC_OR_DEPARTMENT_OTHER): Payer: Self-pay

## 2016-06-05 DIAGNOSIS — J329 Chronic sinusitis, unspecified: Secondary | ICD-10-CM | POA: Diagnosis not present

## 2016-06-05 DIAGNOSIS — Z23 Encounter for immunization: Secondary | ICD-10-CM | POA: Diagnosis not present

## 2016-07-02 DIAGNOSIS — N5201 Erectile dysfunction due to arterial insufficiency: Secondary | ICD-10-CM | POA: Diagnosis not present

## 2016-07-02 DIAGNOSIS — R351 Nocturia: Secondary | ICD-10-CM | POA: Diagnosis not present

## 2016-07-02 DIAGNOSIS — R972 Elevated prostate specific antigen [PSA]: Secondary | ICD-10-CM | POA: Diagnosis not present

## 2016-07-02 DIAGNOSIS — N401 Enlarged prostate with lower urinary tract symptoms: Secondary | ICD-10-CM | POA: Diagnosis not present

## 2016-07-30 DIAGNOSIS — J069 Acute upper respiratory infection, unspecified: Secondary | ICD-10-CM | POA: Diagnosis not present

## 2016-08-21 DIAGNOSIS — J329 Chronic sinusitis, unspecified: Secondary | ICD-10-CM | POA: Diagnosis not present

## 2016-08-21 DIAGNOSIS — G479 Sleep disorder, unspecified: Secondary | ICD-10-CM | POA: Diagnosis not present

## 2016-09-26 ENCOUNTER — Other Ambulatory Visit: Payer: Self-pay | Admitting: Physician Assistant

## 2016-09-26 DIAGNOSIS — R972 Elevated prostate specific antigen [PSA]: Secondary | ICD-10-CM | POA: Diagnosis not present

## 2016-09-26 DIAGNOSIS — I10 Essential (primary) hypertension: Secondary | ICD-10-CM | POA: Diagnosis not present

## 2016-09-26 DIAGNOSIS — Z0000 Encounter for general adult medical examination without abnormal findings: Secondary | ICD-10-CM | POA: Diagnosis not present

## 2016-09-26 DIAGNOSIS — Z87891 Personal history of nicotine dependence: Secondary | ICD-10-CM

## 2016-09-26 DIAGNOSIS — N528 Other male erectile dysfunction: Secondary | ICD-10-CM | POA: Diagnosis not present

## 2016-09-26 DIAGNOSIS — R9431 Abnormal electrocardiogram [ECG] [EKG]: Secondary | ICD-10-CM | POA: Diagnosis not present

## 2016-10-02 ENCOUNTER — Ambulatory Visit
Admission: RE | Admit: 2016-10-02 | Discharge: 2016-10-02 | Disposition: A | Discharge status: Home / Self Care | Payer: No Typology Code available for payment source | Source: Ambulatory Visit | Attending: Physician Assistant | Admitting: Physician Assistant

## 2016-10-02 DIAGNOSIS — Z87891 Personal history of nicotine dependence: Secondary | ICD-10-CM

## 2016-10-02 DIAGNOSIS — Z136 Encounter for screening for cardiovascular disorders: Secondary | ICD-10-CM | POA: Diagnosis not present

## 2016-11-07 DIAGNOSIS — J329 Chronic sinusitis, unspecified: Secondary | ICD-10-CM | POA: Diagnosis not present

## 2016-11-07 DIAGNOSIS — G479 Sleep disorder, unspecified: Secondary | ICD-10-CM | POA: Diagnosis not present

## 2016-12-24 DIAGNOSIS — L0390 Cellulitis, unspecified: Secondary | ICD-10-CM | POA: Diagnosis not present

## 2016-12-29 ENCOUNTER — Emergency Department (HOSPITAL_COMMUNITY)
Admission: EM | Admit: 2016-12-29 | Discharge: 2016-12-29 | Disposition: A | Discharge status: Home / Self Care | Payer: Medicare Other | Attending: Emergency Medicine | Admitting: Emergency Medicine

## 2016-12-29 ENCOUNTER — Encounter (HOSPITAL_COMMUNITY): Payer: Self-pay

## 2016-12-29 DIAGNOSIS — L03111 Cellulitis of right axilla: Secondary | ICD-10-CM

## 2016-12-29 DIAGNOSIS — I10 Essential (primary) hypertension: Secondary | ICD-10-CM | POA: Diagnosis not present

## 2016-12-29 DIAGNOSIS — Z79899 Other long term (current) drug therapy: Secondary | ICD-10-CM | POA: Diagnosis not present

## 2016-12-29 DIAGNOSIS — L02411 Cutaneous abscess of right axilla: Secondary | ICD-10-CM | POA: Insufficient documentation

## 2016-12-29 DIAGNOSIS — Z87891 Personal history of nicotine dependence: Secondary | ICD-10-CM | POA: Insufficient documentation

## 2016-12-29 DIAGNOSIS — L0291 Cutaneous abscess, unspecified: Secondary | ICD-10-CM

## 2016-12-29 LAB — CBC WITH DIFFERENTIAL/PLATELET
Basophils Absolute: 0 10*3/uL (ref 0.0–0.1)
Basophils Relative: 0 %
EOS ABS: 0.2 10*3/uL (ref 0.0–0.7)
EOS PCT: 3 %
HCT: 40.2 % (ref 39.0–52.0)
Hemoglobin: 13.8 g/dL (ref 13.0–17.0)
LYMPHS ABS: 1.4 10*3/uL (ref 0.7–4.0)
LYMPHS PCT: 15 %
MCH: 29.6 pg (ref 26.0–34.0)
MCHC: 34.3 g/dL (ref 30.0–36.0)
MCV: 86.1 fL (ref 78.0–100.0)
MONO ABS: 0.9 10*3/uL (ref 0.1–1.0)
Monocytes Relative: 10 %
Neutro Abs: 6.5 10*3/uL (ref 1.7–7.7)
Neutrophils Relative %: 72 %
PLATELETS: 218 10*3/uL (ref 150–400)
RBC: 4.67 MIL/uL (ref 4.22–5.81)
RDW: 13.2 % (ref 11.5–15.5)
WBC: 9.1 10*3/uL (ref 4.0–10.5)

## 2016-12-29 LAB — COMPREHENSIVE METABOLIC PANEL
ALBUMIN: 3.6 g/dL (ref 3.5–5.0)
ALT: 15 U/L — AB (ref 17–63)
AST: 16 U/L (ref 15–41)
Alkaline Phosphatase: 70 U/L (ref 38–126)
Anion gap: 7 (ref 5–15)
BUN: 17 mg/dL (ref 6–20)
CHLORIDE: 107 mmol/L (ref 101–111)
CO2: 24 mmol/L (ref 22–32)
CREATININE: 1.08 mg/dL (ref 0.61–1.24)
Calcium: 9.3 mg/dL (ref 8.9–10.3)
GFR calc Af Amer: >60 mL/min (ref >60)
GLUCOSE: 94 mg/dL (ref 65–99)
Potassium: 4.2 mmol/L (ref 3.5–5.1)
Sodium: 138 mmol/L (ref 135–145)
Total Bilirubin: 0.4 mg/dL (ref 0.3–1.2)
Total Protein: 6.7 g/dL (ref 6.5–8.1)

## 2016-12-29 LAB — URINALYSIS, ROUTINE W REFLEX MICROSCOPIC
Bilirubin Urine: NEGATIVE
GLUCOSE, UA: NEGATIVE mg/dL
Hgb urine dipstick: NEGATIVE
KETONES UR: NEGATIVE mg/dL
Leukocytes, UA: NEGATIVE
NITRITE: NEGATIVE
PH: 5 (ref 5.0–8.0)
PROTEIN: NEGATIVE mg/dL
Specific Gravity, Urine: 1.004 — ABNORMAL LOW (ref 1.005–1.030)

## 2016-12-29 LAB — I-STAT CG4 LACTIC ACID, ED: Lactic Acid, Venous: 0.78 mmol/L (ref 0.5–1.9)

## 2016-12-29 MED ORDER — LIDOCAINE HCL 2 % IJ SOLN
10.0000 mL | Freq: Once | INTRAMUSCULAR | Status: AC
  Administered 2016-12-29: 200 mg via INTRADERMAL
  Filled 2016-12-29: qty 20

## 2016-12-29 MED ORDER — NAPROXEN 500 MG PO TBEC: 500.0000 mg | DELAYED_RELEASE_TABLET | Freq: Two times a day (BID) | ORAL | 0 refills | Status: AC

## 2016-12-29 MED ORDER — NAPROXEN 500 MG PO TABS
500.0000 mg | ORAL_TABLET | Freq: Once | ORAL | Status: AC
  Administered 2016-12-29: 500 mg via ORAL
  Filled 2016-12-29: qty 1

## 2016-12-29 MED ORDER — CLINDAMYCIN HCL 150 MG PO CAPS: 300.0000 mg | ORAL_CAPSULE | Freq: Four times a day (QID) | ORAL | 0 refills | Status: AC

## 2016-12-29 MED ORDER — CLINDAMYCIN HCL 300 MG PO CAPS
300.0000 mg | ORAL_CAPSULE | Freq: Once | ORAL | Status: AC
  Administered 2016-12-29: 300 mg via ORAL
  Filled 2016-12-29: qty 1
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
## 2016-12-29 NOTE — ED Triage Notes (Signed)
PT C/O CONTINUED SWELLING TO THE RIGHT AXILLA SINCE LAST Saturday. PT STS HE WAS CLEANING UP HIS HOUSE AND BY THE END OF THE DAY NOTICED 3 BITES. HE WAS SEEN BY HIS PCP ON Monday, AND HAS BEEN TAKING DOXYCYCLINE SINCE THEN. TODAY, THE SWELLING IS MUCH WORSE. DENIES FEVER OR DRAINAGE.

## 2016-12-29 NOTE — Discharge Instructions (Signed)
1. Medications: clindamycin, naprosyn 2. Treatment: keep wound clean and dry.Marland KitchenApply warm compresses to arm pit throughout the day. Take antibiotic and completion. Take ec naprosyn twice daily for the next few days as directed, as needed for pain 3. Follow Up: Followup with primary care in 2-3 days for wound recheck and packing removal.  Return to emergency department for emergent changing or worsening symptoms.

## 2017-01-01 DIAGNOSIS — L0291 Cutaneous abscess, unspecified: Secondary | ICD-10-CM | POA: Diagnosis not present

## 2017-02-07 DIAGNOSIS — K219 Gastro-esophageal reflux disease without esophagitis: Secondary | ICD-10-CM | POA: Diagnosis not present

## 2017-02-07 DIAGNOSIS — G479 Sleep disorder, unspecified: Secondary | ICD-10-CM | POA: Diagnosis not present

## 2017-02-07 DIAGNOSIS — J329 Chronic sinusitis, unspecified: Secondary | ICD-10-CM | POA: Diagnosis not present

## 2017-02-20 DIAGNOSIS — J329 Chronic sinusitis, unspecified: Secondary | ICD-10-CM | POA: Diagnosis not present

## 2017-03-29 DIAGNOSIS — I10 Essential (primary) hypertension: Secondary | ICD-10-CM | POA: Diagnosis not present

## 2017-04-05 DIAGNOSIS — N289 Disorder of kidney and ureter, unspecified: Secondary | ICD-10-CM | POA: Diagnosis not present

## 2017-05-13 DIAGNOSIS — I10 Essential (primary) hypertension: Secondary | ICD-10-CM | POA: Diagnosis not present

## 2017-06-04 IMAGING — US US AORTA SCREENING (MEDICARE)
1 series · 9 of 9 positions shown · non-contrast
Comparison: None.

CLINICAL DATA: Male between 65-75 years of age with a smoking
history.

EXAM:
US ABDOMINAL AORTA MEDICARE SCREENING
TECHNIQUE: Ultrasound examination of the abdominal aorta was performed as a
screening evaluation for abdominal aortic aneurysm.

[Series 1: us aorta screening (medicare) · 0.25mm/px · 9 of 9 slices shown]
[im 1/9]
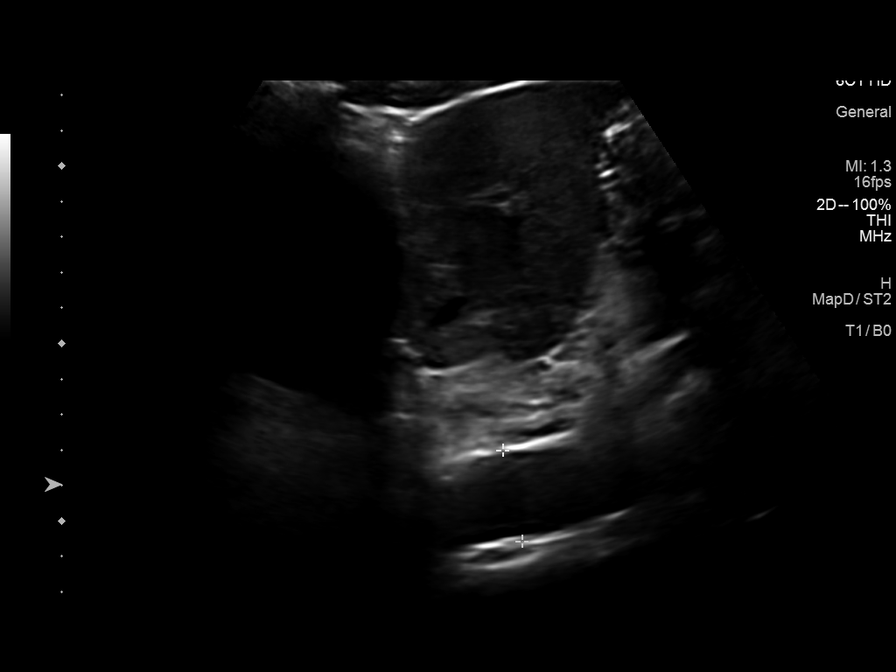
[im 2/9]
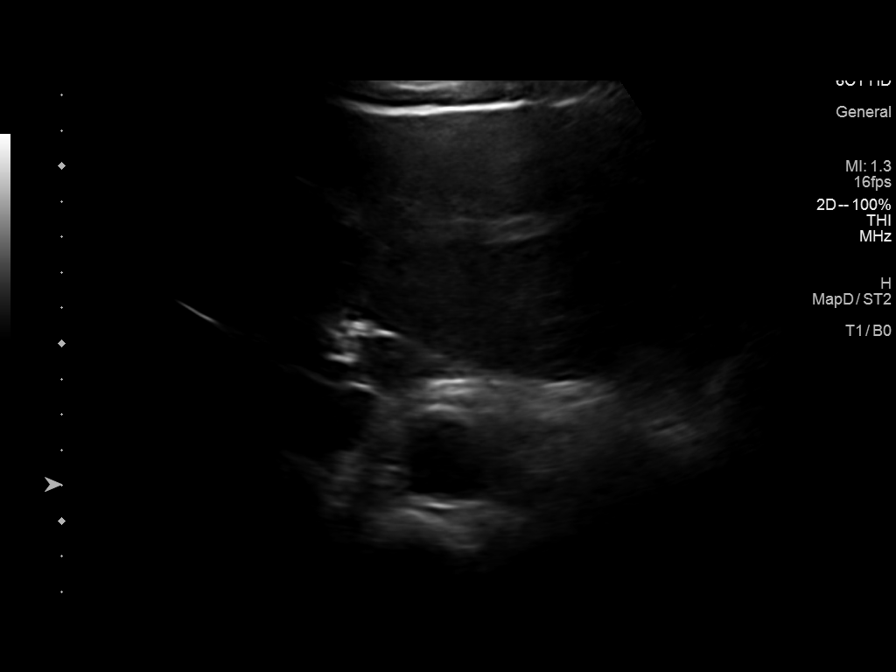
[im 3/9]
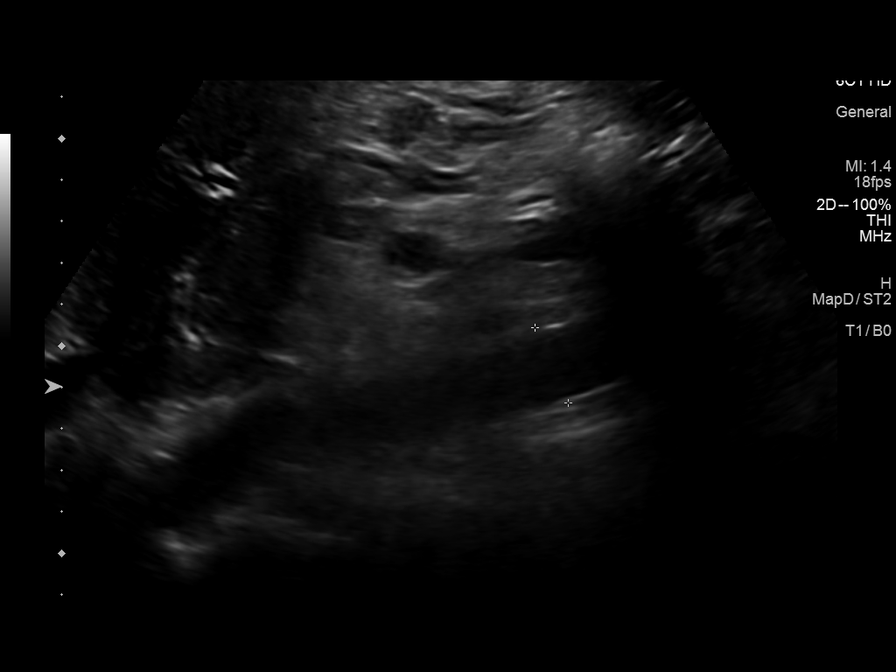
[im 4/9]
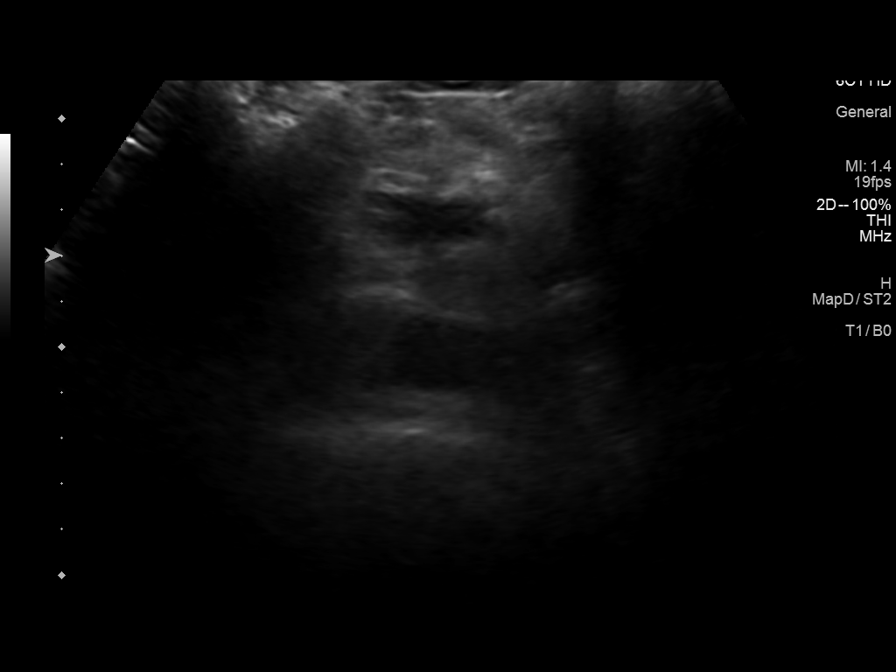
[im 5/9]
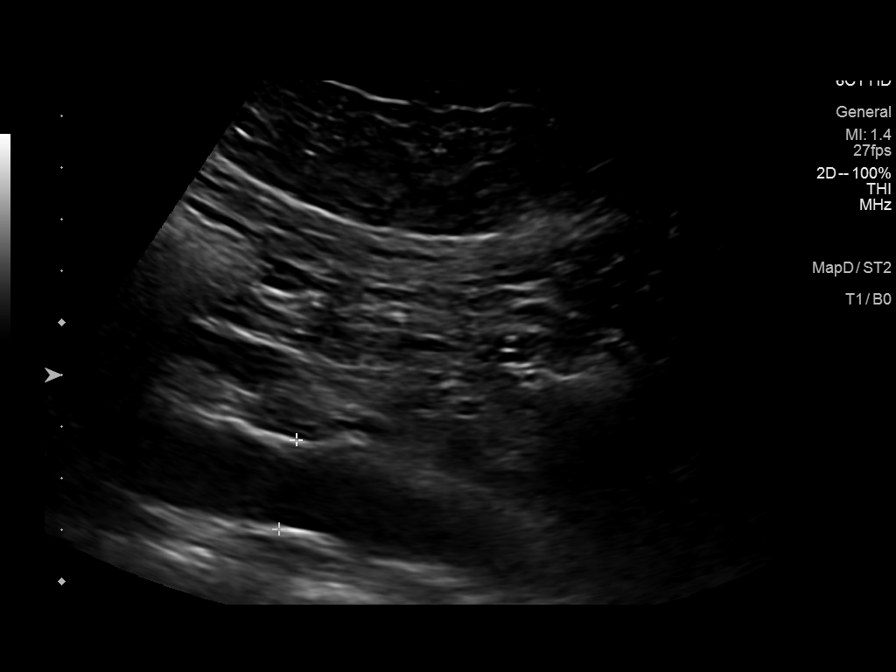
[im 6/9]
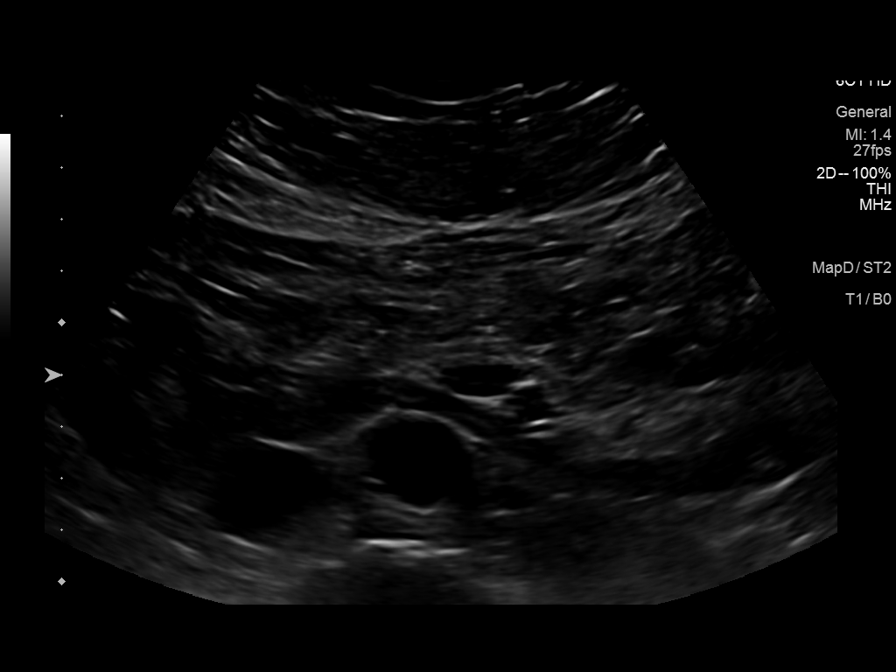
[im 7/9]
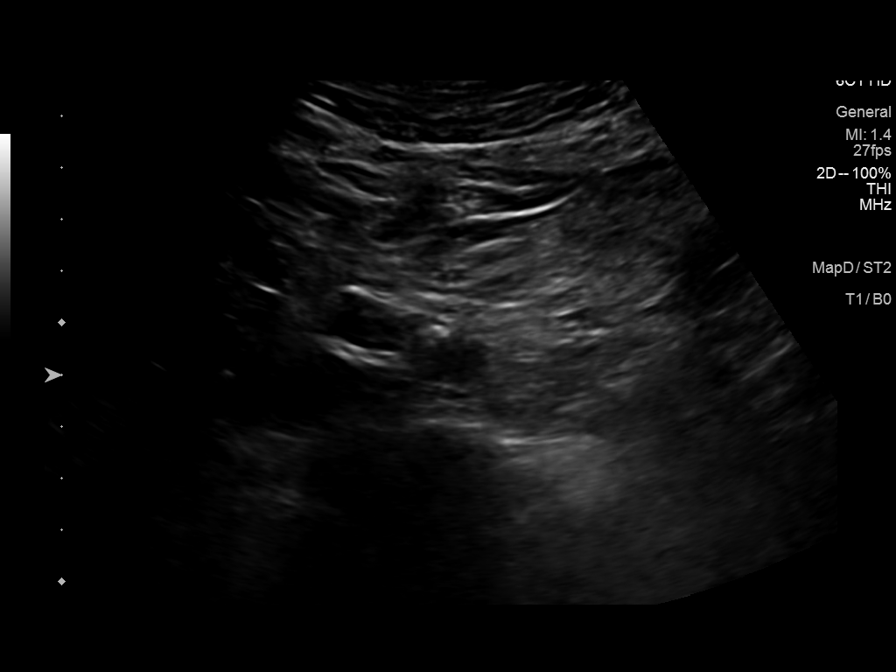
[im 8/9]
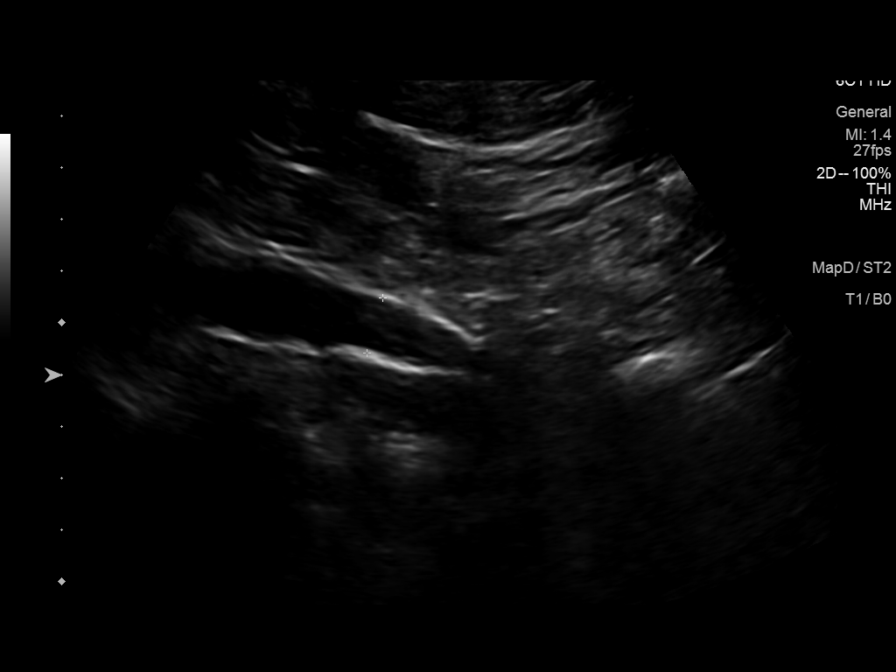
[im 9/9]
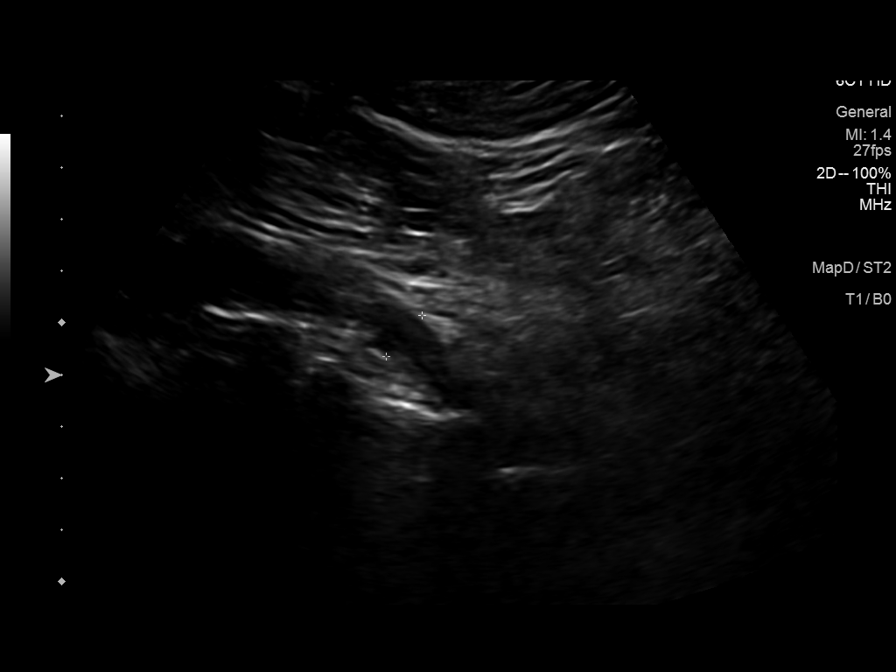

[9 of 9 positions shown; findings below may reference images not displayed]

FINDINGS: Maximum Diameter:  2.6 cm

ABDOMINAL AORTA

Proximal:  2.6 cm

Mid:  2.0 cm

Distal:  1.8 cm

Other: Right common iliac artery measures 1.1 cm. Left common iliac
artery measures 1.1 cm.
IMPRESSION: Negative for an abdominal aortic aneurysm.

## 2017-07-05 DIAGNOSIS — R351 Nocturia: Secondary | ICD-10-CM | POA: Diagnosis not present

## 2017-07-05 DIAGNOSIS — R972 Elevated prostate specific antigen [PSA]: Secondary | ICD-10-CM | POA: Diagnosis not present

## 2017-07-05 DIAGNOSIS — N401 Enlarged prostate with lower urinary tract symptoms: Secondary | ICD-10-CM | POA: Diagnosis not present

## 2017-07-20 DIAGNOSIS — J069 Acute upper respiratory infection, unspecified: Secondary | ICD-10-CM | POA: Diagnosis not present

## 2017-07-23 DIAGNOSIS — J069 Acute upper respiratory infection, unspecified: Secondary | ICD-10-CM | POA: Diagnosis not present

## 2017-09-04 DIAGNOSIS — J069 Acute upper respiratory infection, unspecified: Secondary | ICD-10-CM | POA: Diagnosis not present

## 2017-10-15 DIAGNOSIS — Z23 Encounter for immunization: Secondary | ICD-10-CM | POA: Diagnosis not present

## 2017-10-15 DIAGNOSIS — Z1159 Encounter for screening for other viral diseases: Secondary | ICD-10-CM | POA: Diagnosis not present

## 2017-10-15 DIAGNOSIS — I10 Essential (primary) hypertension: Secondary | ICD-10-CM | POA: Diagnosis not present

## 2017-10-15 DIAGNOSIS — Z0000 Encounter for general adult medical examination without abnormal findings: Secondary | ICD-10-CM | POA: Diagnosis not present

## 2017-10-24 DIAGNOSIS — J069 Acute upper respiratory infection, unspecified: Secondary | ICD-10-CM | POA: Diagnosis not present

## 2017-11-12 DIAGNOSIS — J309 Allergic rhinitis, unspecified: Secondary | ICD-10-CM | POA: Diagnosis not present

## 2017-11-14 DIAGNOSIS — R7309 Other abnormal glucose: Secondary | ICD-10-CM | POA: Diagnosis not present

## 2017-12-11 DIAGNOSIS — R0981 Nasal congestion: Secondary | ICD-10-CM | POA: Diagnosis not present

## 2017-12-17 DIAGNOSIS — J329 Chronic sinusitis, unspecified: Secondary | ICD-10-CM | POA: Diagnosis not present

## 2017-12-17 DIAGNOSIS — R062 Wheezing: Secondary | ICD-10-CM | POA: Diagnosis not present

## 2017-12-17 DIAGNOSIS — R05 Cough: Secondary | ICD-10-CM | POA: Diagnosis not present

## 2017-12-30 DIAGNOSIS — M79642 Pain in left hand: Secondary | ICD-10-CM | POA: Diagnosis not present

## 2018-01-20 DIAGNOSIS — M79641 Pain in right hand: Secondary | ICD-10-CM | POA: Diagnosis not present

## 2018-01-20 DIAGNOSIS — M79642 Pain in left hand: Secondary | ICD-10-CM | POA: Diagnosis not present

## 2018-02-18 DIAGNOSIS — J209 Acute bronchitis, unspecified: Secondary | ICD-10-CM | POA: Diagnosis not present

## 2018-04-03 DIAGNOSIS — R69 Illness, unspecified: Secondary | ICD-10-CM | POA: Diagnosis not present

## 2018-04-23 DIAGNOSIS — M1811 Unilateral primary osteoarthritis of first carpometacarpal joint, right hand: Secondary | ICD-10-CM | POA: Diagnosis not present

## 2018-05-07 DIAGNOSIS — R05 Cough: Secondary | ICD-10-CM | POA: Diagnosis not present

## 2018-05-07 DIAGNOSIS — J329 Chronic sinusitis, unspecified: Secondary | ICD-10-CM | POA: Diagnosis not present

## 2018-05-12 DIAGNOSIS — I10 Essential (primary) hypertension: Secondary | ICD-10-CM | POA: Diagnosis not present

## 2018-06-05 DIAGNOSIS — R69 Illness, unspecified: Secondary | ICD-10-CM | POA: Diagnosis not present

## 2018-06-18 DIAGNOSIS — R69 Illness, unspecified: Secondary | ICD-10-CM | POA: Diagnosis not present

## 2018-07-10 DIAGNOSIS — R972 Elevated prostate specific antigen [PSA]: Secondary | ICD-10-CM | POA: Diagnosis not present

## 2018-07-10 DIAGNOSIS — R351 Nocturia: Secondary | ICD-10-CM | POA: Diagnosis not present

## 2018-07-10 DIAGNOSIS — N401 Enlarged prostate with lower urinary tract symptoms: Secondary | ICD-10-CM | POA: Diagnosis not present

## 2018-07-13 DIAGNOSIS — M545 Low back pain: Secondary | ICD-10-CM | POA: Diagnosis not present

## 2018-08-15 DIAGNOSIS — J069 Acute upper respiratory infection, unspecified: Secondary | ICD-10-CM | POA: Diagnosis not present

## 2018-09-05 DIAGNOSIS — M2040 Other hammer toe(s) (acquired), unspecified foot: Secondary | ICD-10-CM | POA: Diagnosis not present

## 2018-09-09 ENCOUNTER — Other Ambulatory Visit: Payer: Self-pay | Admitting: Orthopaedic Surgery

## 2018-09-16 DIAGNOSIS — R69 Illness, unspecified: Secondary | ICD-10-CM | POA: Diagnosis not present

## 2018-09-17 ENCOUNTER — Encounter (HOSPITAL_BASED_OUTPATIENT_CLINIC_OR_DEPARTMENT_OTHER): Payer: Self-pay | Admitting: *Deleted

## 2018-09-17 ENCOUNTER — Other Ambulatory Visit: Payer: Self-pay

## 2018-09-18 ENCOUNTER — Encounter (HOSPITAL_BASED_OUTPATIENT_CLINIC_OR_DEPARTMENT_OTHER)
Admission: RE | Admit: 2018-09-18 | Discharge: 2018-09-18 | Disposition: A | Discharge status: Home / Self Care | Payer: Medicare HMO | Source: Ambulatory Visit | Attending: Orthopaedic Surgery | Admitting: Orthopaedic Surgery

## 2018-09-18 DIAGNOSIS — Z01810 Encounter for preprocedural cardiovascular examination: Secondary | ICD-10-CM | POA: Insufficient documentation

## 2018-09-18 NOTE — Progress Notes (Signed)
EKG reviewed by Dr. Odonno, will proceed with surgery as scheduled. 

## 2018-09-23 ENCOUNTER — Ambulatory Visit (HOSPITAL_BASED_OUTPATIENT_CLINIC_OR_DEPARTMENT_OTHER)
Admission: RE | Admit: 2018-09-23 | Discharge: 2018-09-23 | Disposition: A | Discharge status: Home / Self Care | Payer: Medicare HMO | Attending: Orthopaedic Surgery | Admitting: Orthopaedic Surgery

## 2018-09-23 ENCOUNTER — Encounter (HOSPITAL_BASED_OUTPATIENT_CLINIC_OR_DEPARTMENT_OTHER)
Admission: RE | Disposition: A | Discharge status: Home / Self Care | Payer: Self-pay | Source: Home / Self Care | Attending: Orthopaedic Surgery

## 2018-09-23 ENCOUNTER — Other Ambulatory Visit: Payer: Self-pay

## 2018-09-23 ENCOUNTER — Encounter (HOSPITAL_BASED_OUTPATIENT_CLINIC_OR_DEPARTMENT_OTHER): Payer: Self-pay | Admitting: Certified Registered"

## 2018-09-23 ENCOUNTER — Ambulatory Visit (HOSPITAL_BASED_OUTPATIENT_CLINIC_OR_DEPARTMENT_OTHER): Payer: Medicare HMO | Admitting: Certified Registered"

## 2018-09-23 DIAGNOSIS — F419 Anxiety disorder, unspecified: Secondary | ICD-10-CM | POA: Diagnosis not present

## 2018-09-23 DIAGNOSIS — M7742 Metatarsalgia, left foot: Secondary | ICD-10-CM | POA: Insufficient documentation

## 2018-09-23 DIAGNOSIS — K219 Gastro-esophageal reflux disease without esophagitis: Secondary | ICD-10-CM | POA: Insufficient documentation

## 2018-09-23 DIAGNOSIS — M25572 Pain in left ankle and joints of left foot: Secondary | ICD-10-CM | POA: Diagnosis not present

## 2018-09-23 DIAGNOSIS — S93135A Subluxation of interphalangeal joint of left lesser toe(s), initial encounter: Secondary | ICD-10-CM | POA: Diagnosis not present

## 2018-09-23 DIAGNOSIS — Z87891 Personal history of nicotine dependence: Secondary | ICD-10-CM | POA: Insufficient documentation

## 2018-09-23 DIAGNOSIS — R69 Illness, unspecified: Secondary | ICD-10-CM | POA: Diagnosis not present

## 2018-09-23 DIAGNOSIS — S93145A Subluxation of metatarsophalangeal joint of left lesser toe(s), initial encounter: Secondary | ICD-10-CM | POA: Diagnosis not present

## 2018-09-23 DIAGNOSIS — I10 Essential (primary) hypertension: Secondary | ICD-10-CM | POA: Insufficient documentation

## 2018-09-23 DIAGNOSIS — M2042 Other hammer toe(s) (acquired), left foot: Secondary | ICD-10-CM | POA: Diagnosis not present

## 2018-09-23 HISTORY — DX: Anxiety disorder, unspecified: F41.9

## 2018-09-23 HISTORY — DX: Gastro-esophageal reflux disease without esophagitis: K21.9

## 2018-09-23 HISTORY — PX: HAMMERTOE RECONSTRUCTION WITH WEIL OSTEOTOMY: SHX5631

## 2018-09-23 HISTORY — DX: Other hammer toe(s) (acquired), left foot: M20.42

## 2018-09-23 SURGERY — HAMMERTOE RECONSTRUCTION WITH WEIL OSTEOTOMY
Anesthesia: General | Site: Foot | Laterality: Left

## 2018-09-23 MED ORDER — MIDAZOLAM HCL 2 MG/2ML IJ SOLN
INTRAMUSCULAR | Status: AC
  Filled 2018-09-23: qty 2

## 2018-09-23 MED ORDER — LIDOCAINE HCL (CARDIAC) PF 100 MG/5ML IV SOSY
PREFILLED_SYRINGE | INTRAVENOUS | Status: DC | PRN
  Administered 2018-09-23: 60 mg via INTRAVENOUS

## 2018-09-23 MED ORDER — HYDROCODONE-ACETAMINOPHEN 5-325 MG PO TABS: 1.0000 | ORAL_TABLET | ORAL | 0 refills | Status: AC | PRN

## 2018-09-23 MED ORDER — 0.9 % SODIUM CHLORIDE (POUR BTL) OPTIME
TOPICAL | Status: DC | PRN
  Administered 2018-09-23: 120 mL

## 2018-09-23 MED ORDER — SCOPOLAMINE 1 MG/3DAYS TD PT72: 1.0000 | MEDICATED_PATCH | Freq: Once | TRANSDERMAL | Status: DC | PRN

## 2018-09-23 MED ORDER — CEFAZOLIN SODIUM-DEXTROSE 2-4 GM/100ML-% IV SOLN
INTRAVENOUS | Status: AC
  Filled 2018-09-23: qty 100

## 2018-09-23 MED ORDER — BUPIVACAINE HCL 0.5 % IJ SOLN
INTRAMUSCULAR | Status: DC | PRN
  Administered 2018-09-23: 27 mL

## 2018-09-23 MED ORDER — POVIDONE-IODINE 10 % EX SWAB: 2.0000 "application " | Freq: Once | CUTANEOUS | Status: DC

## 2018-09-23 MED ORDER — FENTANYL CITRATE (PF) 100 MCG/2ML IJ SOLN
INTRAMUSCULAR | Status: AC
  Filled 2018-09-23: qty 2

## 2018-09-23 MED ORDER — FENTANYL CITRATE (PF) 100 MCG/2ML IJ SOLN
25.0000 ug | INTRAMUSCULAR | Status: DC | PRN
  Administered 2018-09-23 (×2): 50 ug via INTRAVENOUS

## 2018-09-23 MED ORDER — MEPERIDINE HCL 25 MG/ML IJ SOLN: 6.2500 mg | INTRAMUSCULAR | Status: DC | PRN

## 2018-09-23 MED ORDER — CEFAZOLIN SODIUM-DEXTROSE 2-4 GM/100ML-% IV SOLN
2.0000 g | INTRAVENOUS | Status: AC
  Administered 2018-09-23: 2 g via INTRAVENOUS

## 2018-09-23 MED ORDER — DEXAMETHASONE SODIUM PHOSPHATE 10 MG/ML IJ SOLN
INTRAMUSCULAR | Status: DC | PRN
  Administered 2018-09-23: 10 mg via INTRAVENOUS

## 2018-09-23 MED ORDER — MIDAZOLAM HCL 2 MG/2ML IJ SOLN: 1.0000 mg | INTRAMUSCULAR | Status: DC | PRN

## 2018-09-23 MED ORDER — FENTANYL CITRATE (PF) 100 MCG/2ML IJ SOLN
50.0000 ug | INTRAMUSCULAR | Status: AC | PRN
  Administered 2018-09-23 (×2): 25 ug via INTRAVENOUS
  Administered 2018-09-23: 50 ug via INTRAVENOUS

## 2018-09-23 MED ORDER — PROPOFOL 10 MG/ML IV BOLUS
INTRAVENOUS | Status: DC | PRN
  Administered 2018-09-23: 200 mg via INTRAVENOUS

## 2018-09-23 MED ORDER — ONDANSETRON HCL 4 MG/2ML IJ SOLN: 4.0000 mg | Freq: Once | INTRAMUSCULAR | Status: DC | PRN

## 2018-09-23 MED ORDER — ONDANSETRON HCL 4 MG/2ML IJ SOLN
INTRAMUSCULAR | Status: DC | PRN
  Administered 2018-09-23: 4 mg via INTRAVENOUS

## 2018-09-23 MED ORDER — LACTATED RINGERS IV SOLN
INTRAVENOUS | Status: DC
  Administered 2018-09-23 (×2): via INTRAVENOUS

## 2018-09-23 MED ORDER — PROPOFOL 500 MG/50ML IV EMUL
INTRAVENOUS | Status: DC | PRN
  Administered 2018-09-23: 25 ug/kg/min via INTRAVENOUS

## 2018-09-23 MED ORDER — PROPOFOL 10 MG/ML IV BOLUS
INTRAVENOUS | Status: AC
  Filled 2018-09-23: qty 20

## 2018-09-23 SURGICAL SUPPLY — 71 items
BANDAGE ACE 4X5 VEL STRL LF (GAUZE/BANDAGES/DRESSINGS) ×3 IMPLANT
BANDAGE ACE 6X5 VEL STRL LF (GAUZE/BANDAGES/DRESSINGS) ×3 IMPLANT
BANDAGE ESMARK 6X9 LF (GAUZE/BANDAGES/DRESSINGS) IMPLANT
BENZOIN TINCTURE PRP APPL 2/3 (GAUZE/BANDAGES/DRESSINGS) IMPLANT
BIT DRILL PROS QC 1.5 (BIT) ×3 IMPLANT
BLADE LONG MED 25X9 (BLADE) ×3 IMPLANT
BLADE OSC/SAG .038X5.5 CUT EDG (BLADE) ×3 IMPLANT
BLADE SURG 15 STRL LF DISP TIS (BLADE) ×12 IMPLANT
BLADE SURG 15 STRL SS (BLADE) ×6
BNDG COHESIVE 4X5 TAN STRL (GAUZE/BANDAGES/DRESSINGS) IMPLANT
BNDG CONFORM 2 STRL LF (GAUZE/BANDAGES/DRESSINGS) ×3 IMPLANT
BNDG ESMARK 4X9 LF (GAUZE/BANDAGES/DRESSINGS) IMPLANT
BNDG ESMARK 6X9 LF (GAUZE/BANDAGES/DRESSINGS)
CAP PIN PROTECTOR ORTHO WHT (CAP) ×6 IMPLANT
CHLORAPREP W/TINT 26ML (MISCELLANEOUS) ×3 IMPLANT
COVER BACK TABLE 60X90IN (DRAPES) ×3 IMPLANT
COVER WAND RF STERILE (DRAPES) IMPLANT
CUFF TOURNIQUET SINGLE 34IN LL (TOURNIQUET CUFF) ×3 IMPLANT
DECANTER SPIKE VIAL GLASS SM (MISCELLANEOUS) IMPLANT
DRAPE EXTREMITY T 121X128X90 (DISPOSABLE) ×3 IMPLANT
DRAPE IMP U-DRAPE 54X76 (DRAPES) ×3 IMPLANT
DRAPE OEC MINIVIEW 54X84 (DRAPES) ×3 IMPLANT
DRAPE U-SHAPE 47X51 STRL (DRAPES) ×3 IMPLANT
ELECT REM PT RETURN 9FT ADLT (ELECTROSURGICAL) ×3
ELECTRODE REM PT RTRN 9FT ADLT (ELECTROSURGICAL) ×2 IMPLANT
GAUZE SPONGE 4X4 12PLY STRL (GAUZE/BANDAGES/DRESSINGS) ×3 IMPLANT
GAUZE XEROFORM 1X8 LF (GAUZE/BANDAGES/DRESSINGS) ×3 IMPLANT
GLOVE BIO SURGEON STRL SZ7.5 (GLOVE) ×3 IMPLANT
GLOVE BIOGEL PI IND STRL 7.0 (GLOVE) ×2 IMPLANT
GLOVE BIOGEL PI IND STRL 8 (GLOVE) ×4 IMPLANT
GLOVE BIOGEL PI INDICATOR 7.0 (GLOVE) ×1
GLOVE BIOGEL PI INDICATOR 8 (GLOVE) ×2
GLOVE SURG SYN 8.0 (GLOVE) ×6 IMPLANT
GOWN STRL REIN XL XLG (GOWN DISPOSABLE) ×3 IMPLANT
GOWN STRL REUS W/ TWL LRG LVL3 (GOWN DISPOSABLE) IMPLANT
GOWN STRL REUS W/ TWL XL LVL3 (GOWN DISPOSABLE) ×2 IMPLANT
GOWN STRL REUS W/TWL LRG LVL3 (GOWN DISPOSABLE)
GOWN STRL REUS W/TWL XL LVL3 (GOWN DISPOSABLE) ×1
K-WIRE .035X4 (WIRE) ×6 IMPLANT
K-WIRE .045X4 (WIRE) ×6 IMPLANT
NEEDLE HYPO 22GX1.5 SAFETY (NEEDLE) ×3 IMPLANT
NEEDLE HYPO 25X1 1.5 SAFETY (NEEDLE) ×3 IMPLANT
NS IRRIG 1000ML POUR BTL (IV SOLUTION) ×3 IMPLANT
PACK BASIN DAY SURGERY FS (CUSTOM PROCEDURE TRAY) ×3 IMPLANT
PAD CAST 4YDX4 CTTN HI CHSV (CAST SUPPLIES) ×2 IMPLANT
PADDING CAST COTTON 4X4 STRL (CAST SUPPLIES) ×1
PADDING CAST SYNTHETIC 4 (CAST SUPPLIES) ×1
PADDING CAST SYNTHETIC 4X4 STR (CAST SUPPLIES) ×2 IMPLANT
PENCIL BUTTON HOLSTER BLD 10FT (ELECTRODE) ×3 IMPLANT
SCREW CORTEX ST 2.0X14 (Screw) ×3 IMPLANT
SLEEVE SCD COMPRESS KNEE MED (MISCELLANEOUS) ×3 IMPLANT
SPLINT FIBERGLASS 4X30 (CAST SUPPLIES) ×3 IMPLANT
SPONGE LAP 18X18 RF (DISPOSABLE) ×3 IMPLANT
STOCKINETTE 6  STRL (DRAPES) ×1
STOCKINETTE 6 STRL (DRAPES) ×2 IMPLANT
STRIP CLOSURE SKIN 1/2X4 (GAUZE/BANDAGES/DRESSINGS) IMPLANT
SUCTION FRAZIER HANDLE 10FR (MISCELLANEOUS) ×1
SUCTION TUBE FRAZIER 10FR DISP (MISCELLANEOUS) ×2 IMPLANT
SUT ETHILON 3 0 PS 1 (SUTURE) ×3 IMPLANT
SUT FIBERWIRE 2-0 18 17.9 3/8 (SUTURE)
SUT MNCRL AB 3-0 PS2 18 (SUTURE) ×3 IMPLANT
SUT PDS AB 2-0 CT2 27 (SUTURE) ×3 IMPLANT
SUT VIC AB 2-0 SH 27 (SUTURE)
SUT VIC AB 2-0 SH 27XBRD (SUTURE) IMPLANT
SUT VIC AB 3-0 FS2 27 (SUTURE) IMPLANT
SUTURE FIBERWR 2-0 18 17.9 3/8 (SUTURE) IMPLANT
SYR BULB 3OZ (MISCELLANEOUS) ×3 IMPLANT
SYR CONTROL 10ML LL (SYRINGE) ×3 IMPLANT
TOWEL GREEN STERILE FF (TOWEL DISPOSABLE) ×6 IMPLANT
TUBE CONNECTING 20X1/4 (TUBING) ×3 IMPLANT
UNDERPAD 30X30 (UNDERPADS AND DIAPERS) ×3 IMPLANT

## 2018-09-23 NOTE — Discharge Instructions (Signed)
[facility]. [facility] POST-OP INSTRUCTIONS   Pain Management 1. The numbing medicine and your leg will last around 6 hours, take a dose of your pain medicine as soon as you feel it wearing off to avoid rebound pain. 2. Keep your foot elevated above heart level.  Make sure that your heel hangs free ('floats'). 3. Take all prescribed medication as directed. 4. If taking narcotic pain medication you may want to use an over-the-counter stool softener to avoid constipation. 5. You may take over-the-counter NSAIDs (ibuprofen, naproxen, etc.) as well as over-the-counter acetaminophen as directed on the packaging as a supplement for your pain and may also use it to wean away from the prescription medication.  Activity ? Heel Weightbearing as tolerated in a Post op shoe ?   First Postoperative Visit 1. Your first postop visit will be at least 2 weeks after surgery.  This should be scheduled when you schedule surgery. 2. If you do not have a postoperative visit scheduled please call [phone] to schedule an appointment. 3. At the appointment your incision will be evaluated for suture removal, x-rays will be obtained if necessary.  General Instructions 1. Swelling is very common after foot and ankle surgery.  It often takes 3 months for the foot and ankle to begin to feel comfortable.  Some amount of swelling will persist for 6-12 months. 2. DO NOT change the dressing.  If there is a problem with the dressing (too tight, loose, gets wet, etc.) please contact Dr. Donnie MesaAdair's office. 3. DO NOT get the dressing wet.  For showers you can use an over-the-counter cast cover or wrap a washcloth around the top of your dressing and then cover it with a plastic bag and tape it to your leg. 4. DO NOT soak the incision (no tubs, pools, bath, etc.) until you have approval from Dr. Susa SimmondsAdair.  Contact Dr. Garret ReddishAdairs office or go to Emergency Room if: 1. Temperature above 101 F. 2. Increasing pain that is  unresponsive to pain medication or elevation 3. Excessive redness or swelling in your foot 4. Dressing problems - excessive bloody drainage, looseness or tightness, or if dressing gets wet 5. Develop pain, swelling, warmth, or discoloration of your calf   Post Anesthesia Home Care Instructions  Activity: Get plenty of rest for the remainder of the day. A responsible individual must stay with you for 24 hours following the procedure.  For the next 24 hours, DO NOT: -Drive a car -Advertising copywriterperate machinery -Drink alcoholic beverages -Take any medication unless instructed by your physician -Make any legal decisions or sign important papers.  Meals: Start with liquid foods such as gelatin or soup. Progress to regular foods as tolerated. Avoid greasy, spicy, heavy foods. If nausea and/or vomiting occur, drink only clear liquids until the nausea and/or vomiting subsides. Call your physician if vomiting continues.  Special Instructions/Symptoms: Your throat may feel dry or sore from the anesthesia or the breathing tube placed in your throat during surgery. If this causes discomfort, gargle with warm salt water. The discomfort should disappear within 24 hours.  If you had a scopolamine patch placed behind your ear for the management of post- operative nausea and/or vomiting:  1. The medication in the patch is effective for 72 hours, after which it should be removed.  Wrap patch in a tissue and discard in the trash. Wash hands thoroughly with soap and water. 2. You may remove the patch earlier than 72 hours if you experience unpleasant side effects  which may include dry mouth, dizziness or visual disturbances. 3. Avoid touching the patch. Wash your hands with soap and water after contact with the patch.

## 2018-09-23 NOTE — Anesthesia Preprocedure Evaluation (Signed)
Anesthesia Evaluation  Patient identified by MRN, date of birth, ID band Patient awake    Reviewed: Allergy & Precautions, NPO status , Patient's Chart, lab work & pertinent test results  Airway Mallampati: II  TM Distance: >3 FB Neck ROM: Full    Dental no notable dental hx. (+) Teeth Intact, Caps   Pulmonary former smoker,    Pulmonary exam normal breath sounds clear to auscultation       Cardiovascular hypertension, Pt. on medications Normal cardiovascular exam Rhythm:Regular Rate:Normal     Neuro/Psych Anxiety negative neurological ROS     GI/Hepatic GERD  Controlled and Medicated,  Endo/Other  negative endocrine ROS  Renal/GU negative Renal ROS  negative genitourinary   Musculoskeletal Hammertoes right 3rd and 4th toes   Abdominal   Peds  Hematology negative hematology ROS (+)   Anesthesia Other Findings   Reproductive/Obstetrics                             Anesthesia Physical Anesthesia Plan  ASA: II  Anesthesia Plan: General   Post-op Pain Management:    Induction: Intravenous  PONV Risk Score and Plan: 3 and Ondansetron, Dexamethasone, Treatment may vary due to age or medical condition and Midazolam  Airway Management Planned: LMA  Additional Equipment:   Intra-op Plan:   Post-operative Plan: Extubation in OR  Informed Consent: I have reviewed the patients History and Physical, chart, labs and discussed the procedure including the risks, benefits and alternatives for the proposed anesthesia with the patient or authorized representative who has indicated his/her understanding and acceptance.     Dental advisory given  Plan Discussed with: Surgeon and CRNA  Anesthesia Plan Comments:         Anesthesia Quick Evaluation

## 2018-09-23 NOTE — Anesthesia Postprocedure Evaluation (Signed)
Anesthesia Post Note  Patient: Cesar Wilson  Procedure(s) Performed: LEFT SECOND AND THIRD HAMMER TOE CORRECTION  WITH WEIL OSTEOTOMY LEFT SECOND (Left Foot)     Patient location during evaluation: PACU Anesthesia Type: General Level of consciousness: awake and alert and oriented Pain management: pain level controlled Vital Signs Assessment: post-procedure vital signs reviewed and stable Respiratory status: spontaneous breathing, nonlabored ventilation and respiratory function stable Cardiovascular status: blood pressure returned to baseline and stable Postop Assessment: no apparent nausea or vomiting Anesthetic complications: no    Last Vitals:  Vitals:   09/23/18 1300 09/23/18 1316  BP: 133/81 (!) 169/88  Pulse: 74 73  Resp: 17 16  Temp:    SpO2: 97% 99%    Last Pain:  Vitals:   09/23/18 1316  TempSrc:   PainSc: 3                  Ethridge Sollenberger A.

## 2018-09-23 NOTE — Op Note (Signed)
Cesar Wilson male 68 y.o. 09/23/2018  PreOperative Diagnosis: Left second and third hammertoes with MTP subluxation Left metatarsalgia   PostOperative Diagnosis: Same  PROCEDURE: Left second hammertoe correction through PIP arthroplasty Left 2nd MTP capsulotomy Left 2nd extensor digitorum longus and brevis tendon lengthening Left flexor digitorum longus tenotomy Left second metatarsal shortening osteotomy Left third hammertoe correction through PIP arthroplasty Left 3rd MTP capsulotomy Left 3rd extensor digitorum longus and brevis tendon lengthening Left flexor digitorum longus tenotomy third toe   SURGEON: Nicki Guadalajara, MD  ASSISTANT: None  ANESTHESIA: General LMA anesthesia with local anesthesia  FINDINGS: Left second and third hammertoe with MTP joint subluxation Fixed PIP contracture of the second and third toes  IMPLANTS: 0.45 and 0.35 K wires 2.0 millimeter screw Synthes  INDICATIONS:67 y.o. male has been dealing with pain in the second third toes for years.  He had failed conservative treatment the form of shoe modification, activity modifications, splinting and taping techniques.  Given the length of disability and pain level he was indicated for hammertoe correction.  He also had complaints of plantar forefoot pain.  We discussed the risk benefits alternatives surgery which included but were not limited to wound healing complication, infection, nonunion, malunion, need for further surgery, demonstrating structures, continued pain, elevation of the toes despite correction.  After weighing these risks he opted proceed with surgery.  We also discussed perioperative anesthetic risk was occluded death.  He understood the postoperative weightbearing restrictions.  PROCEDURE: Patient was identified in the preoperative holding area.  The left foot was marked by myself.  The consent was numbness of the patient.  He was taken to the operative suite placed supine the  operative table.  General LMA anesthesia was induced that difficulty.  A bump was placed under the left hip.  All bony prominences well-padded.  Preoperative antibiotics were given.  The left lower extremity was prepped and draped in the usual sterile fashion.  Surgical timeout was performed.  A 4 inch Esmarch tourniquet was placed.  This was placed at the ankle.  We started by making a longitudinal incision overlying the PIP joint of the second toe down along the toe and in a curvilinear fashion at the extensor crease across the second and third MTP joint area.  This taken sharply at the skin subcutaneous tissue.  The extensor digitorum longus tendon to the second toe was identified and mobilized.  We then identified the extensor digitorum brevis tendon.  These were transected distally and the extensor digitorum longus tendon was lengthened in a Z-lengthening fashion.  We then used blunt dissection to identify the MTP joint.  The capsular tissue was incised dorsally in a transverse fashion through the MTP joint capsule.  Then the collateral ligaments were incised sharply with 15 blade to allow mobilization of the MTP joint.  There was significant amount of subluxation the MTP joint.  The cartilaginous structures were inspected and found to be intact without evidence of chondrosis.  Given the soft tissue contracture dorsally it was decided that a Weil osteotomy repair would be performed.  Using a sagittal saw after identifying the distal aspect of the second metatarsal and oblique cut was made through the dorsal metatarsal head back to the plantar metatarsal.  This would allow the metatarsal head to shift back 2 to 3 mm.  This was then fixed with a 2.0 millimeter screw.  Good purchase was obtained.  Then we turned our attention to the PIP joint.  The capsular tissue overlying this  was cut and an elliptical manner.  The joint was entered and the collateral ligaments were incised with a 15 blade.  Then the distal  aspect of the proximal phalanx the proximal aspect the distal phalanx was not removed with a sagittal saw.  The underlying plantar plate tissue was entered using a hemostat and the flexor digitorum longus tendons were cut sharply with a 15 blade.  Then we turned our attention to the third toe.  The extensor digitorum longus to the third toe and the extensor digitorum brevis to the third toe were identified and mobilized.  The extensor digitorum brevis tendon was transected distally and the extensor digitorum longus tendon was Z lengthened.  Then the underlying capsular tissue was identified and using blunt dissection using a tenotomy scissors the collateral ligament complex was dissected.  Then a transverse capsulotomy was performed overlying the third MTP joint.  Then the collateral ligaments were released to allow the toe to reduce at the MTP joint.  Then a separate longitudinal incision overlying the PIP joint of the third toe was performed.  This was taken sharply down through skin subcutaneous tissue.  Skin flaps were mobilized.  Then an elliptical incision was made over the capsular tissue overlying the third PIP joint.  Then the PIP joint was mobilized and the collateral ligaments transected medially and laterally.  Then using a sagittal saw the proximal aspect of the distal middle phalanx and that distal aspect of the proximal phalanx was removed with a sagittal saw.  This was a PIP arthroplasty of the third toe.  Then using hemostat the flexor digitorum longus tendon underlying the third PIP joint was mobilized and transected.  Then a 0.45 K wire was placed in the middle phalanx of the second toe and a anterograde fashion.  This was across the DIP joint.  Then a second K wire 0.35 was placed next to this.  Then the PIP joint arthroplasty site was reduced and the 0.45 K wire was run up into the proximal phalanx to the base.  The 0.35 K wire was done in a similar manner.  There was good reduction of the PIP  arthroplasty site.  Then the same was done for the third toe using an 0.45 K wire and a 0.35 K wire across the PIP joints.  Then the tourniquet was released.  The foot was placed in a dorsiflexed position to allow toes in her natural standing position.  Then the 0.45 K wire of the second and third toe was run across the MTP joint holding this in place.  The ends of the K wires were trimmed and the 0.35 K wire were bent in a circular fashion and the 0.45 K wire was bent 90 degrees angle and a cap was placed.  Fluoroscopy was used to confirm pin placement, MTP joint reduction and screw length.  Then using a 2-0 PDS suture the extensor tendons were sewn in a lengthened fashion and repaired.  The wounds were then irrigated with normal saline.  Hemostasis was obtained.  The subcutaneous tissue was closed with 3-0 Monocryl and the skin with 3-0 nylon in interrupted fashion.  Xeroform, 4 x 4's, 2 inch Kling and sterile she cotton were placed.  He was placed in a 4 inch Ace wrap.  He will placed in a postoperative shoe in the PACU.  There were no complications.  Patient tolerated this well.  POST OPERATIVE INSTRUCTIONS: Heel weightbearing left lower extremity Keep dressing in place. Call the office  with concerns No need for DVT prophylaxis ambulatory patient. He will follow-up in 2 weeks for wound check and x-rays.   TOURNIQUET TIME: 48 minutes  BLOOD LOSS:  Minimal         DRAINS: none         SPECIMEN: none       COMPLICATIONS:  * No complications entered in OR log *         Disposition: PACU - hemodynamically stable.         Condition: stable

## 2018-09-23 NOTE — Transfer of Care (Signed)
Immediate Anesthesia Transfer of Care Note  Patient: Cesar Wilson  Procedure(s) Performed: LEFT SECOND AND THIRD HAMMER TOE CORRECTION  WITH WEIL OSTEOTOMY LEFT SECOND (Left Foot)  Patient Location: PACU  Anesthesia Type:General  Level of Consciousness: awake, alert  and oriented  Airway & Oxygen Therapy: Patient Spontanous Breathing and Patient connected to face mask oxygen  Post-op Assessment: Report given to RN and Post -op Vital signs reviewed and stable  Post vital signs: Reviewed and stable  Last Vitals:  Vitals Value Taken Time  BP 152/83 09/23/2018 12:36 PM  Temp    Pulse 41 09/23/2018 12:38 PM  Resp 13 09/23/2018 12:38 PM  SpO2 100 % 09/23/2018 12:38 PM  Vitals shown include unvalidated device data.  Last Pain:  Vitals:   09/23/18 0820  TempSrc: Oral  PainSc: 0-No pain         Complications: No apparent anesthesia complications

## 2018-09-23 NOTE — Anesthesia Procedure Notes (Signed)
Procedure Name: LMA Insertion Date/Time: 09/23/2018 11:00 AM Performed by: Sheryn Bison, CRNA Pre-anesthesia Checklist: Patient identified, Emergency Drugs available, Suction available and Patient being monitored Patient Re-evaluated:Patient Re-evaluated prior to induction Oxygen Delivery Method: Circle system utilized Preoxygenation: Pre-oxygenation with 100% oxygen Induction Type: IV induction Ventilation: Mask ventilation without difficulty LMA: LMA inserted LMA Size: 4.0 Number of attempts: 1 Airway Equipment and Method: Bite block Placement Confirmation: positive ETCO2 Tube secured with: Tape Dental Injury: Teeth and Oropharynx as per pre-operative assessment

## 2018-09-23 NOTE — H&P (Signed)
Cesar Wilson is an 68 y.o. male.   Chief Complaint: Left foot second and third hammertoes HPI: Patient is been dealing with hammertoes for several months.  He has had difficulty with shoe wear.  He has pain over the dorsal of the toes.  He is tried conservative treatment in the form of taping technique as well as activity modifications and shoe modifications without relief.  He is here today for surgery.  Surgery in the form of second third hammertoe correction.  Possible Weil osteotomy of the second.  He has no new complaints today.  Denies any recent fevers or chills.  He is ready for surgery.  Past Medical History:  Diagnosis Date  . Anxiety   . Biceps tendon rupture    left distal  . GERD (gastroesophageal reflux disease)   . Hammer toe of left foot    2nd and 3rd toes  . Hypertension    under control; has been on med. x 23 yrs.    Past Surgical History:  Procedure Laterality Date  . DISTAL BICEPS TENDON REPAIR  08/06/2011   Procedure: DISTAL BICEPS TENDON REPAIR;  Surgeon: Mable Paris, MD;  [facility]  [facility];  Service: Orthopedics;  Laterality: Left;  . ORIF ANKLE FRACTURE  12/08/2009   left lat. malleolus  . TONSILLECTOMY AND ADENOIDECTOMY  as a child    History reviewed. No pertinent family history. Social History:  reports that he has quit smoking. He has never used smokeless tobacco. He reports that he does not drink alcohol or use drugs.  Allergies:  Allergies  Allergen Reactions  . Percocet [Oxycodone-Acetaminophen] Other (See Comments)    Keeps him awake, and does not relieve pain well  . Sulfa Antibiotics Hives    Medications Prior to Admission  Medication Sig Dispense Refill  . Ascorbic Acid (VITAMIN C) 1000 MG tablet Take 1,000 mg by mouth daily.    Marland Kitchen CARTIA XT 240 MG 24 hr capsule Take 240 mg by mouth daily.    . cholecalciferol (VITAMIN D) 1000 UNITS tablet Take 1,000 Units by mouth daily. Vit. D3    . clonazePAM (KLONOPIN) 1 MG  tablet Take 2 mg by mouth at bedtime.    . Multiple Vitamins-Minerals (MULTIVITAMINS THER. W/MINERALS) TABS Take 1 tablet by mouth daily.     Marland Kitchen omeprazole (PRILOSEC) 20 MG capsule Take 20 mg by mouth daily.    . quinapril (ACCUPRIL) 40 MG tablet Take 40 mg by mouth daily. AM      No results found for this or any previous visit (from the past 48 hour(s)). No results found.  Review of Systems  Constitutional: Negative.   HENT: Negative.   Eyes: Negative.   Respiratory: Negative.   Cardiovascular: Negative.   Gastrointestinal: Negative.   Musculoskeletal:       Left foot 2nd and 3rd hammertoes  Skin: Negative.   Neurological: Negative.   Psychiatric/Behavioral: Negative.     Blood pressure (!) 188/92, pulse 66, temperature 97.9 F (36.6 C), temperature source Oral, resp. rate 18, height 5\' 10"  (1.778 m), weight 87.7 kg, SpO2 98 %. Physical Exam  Constitutional: He appears well-developed.  HENT:  Head: Normocephalic.  Eyes: Conjunctivae are normal.  Neck: Neck supple.  Cardiovascular: Normal rate.  Respiratory: Effort normal.  GI: Soft.  Musculoskeletal:     Comments: Evaluation of the left foot demonstrate second and third hammertoes.  He does have some hallux valgus interphalangeus but there is plenty of room.  He has fixed  PIP flexion contractures.  Tenderness to palpation about the PIP joints of the second third toes.  Motor intact.  Sensation intact the dorsal plantar surface of the foot.  Foot is warm and well-perfused.     Assessment/Plan We will proceed with planned left foot second and third hammertoe correction.  Possible Weil osteotomy.  He understands the postoperative weightbearing restrictions.  We also discussed the risk, benefits and alternatives of surgery which included but were not limited to wound healing complications, infection, nonunion, malunion, need for further surgery, continued pain, elevation of the toes including floating toe.  We also discussed the  need for further surgery down the line.  Patient understands these risks and would like to proceed with surgery.  Cesar Harthristopher R Lorrane Mccay, MD 09/23/2018, 10:21 AM

## 2018-09-24 ENCOUNTER — Encounter (HOSPITAL_BASED_OUTPATIENT_CLINIC_OR_DEPARTMENT_OTHER): Payer: Self-pay | Admitting: Orthopaedic Surgery

## 2018-09-24 DIAGNOSIS — M2040 Other hammer toe(s) (acquired), unspecified foot: Secondary | ICD-10-CM | POA: Diagnosis not present

## 2018-10-01 DIAGNOSIS — M2040 Other hammer toe(s) (acquired), unspecified foot: Secondary | ICD-10-CM | POA: Diagnosis not present

## 2018-10-08 DIAGNOSIS — M2040 Other hammer toe(s) (acquired), unspecified foot: Secondary | ICD-10-CM | POA: Diagnosis not present

## 2018-10-24 DIAGNOSIS — M2040 Other hammer toe(s) (acquired), unspecified foot: Secondary | ICD-10-CM | POA: Diagnosis not present

## 2018-10-30 DIAGNOSIS — G479 Sleep disorder, unspecified: Secondary | ICD-10-CM | POA: Diagnosis not present

## 2018-10-30 DIAGNOSIS — K219 Gastro-esophageal reflux disease without esophagitis: Secondary | ICD-10-CM | POA: Diagnosis not present

## 2018-10-30 DIAGNOSIS — R972 Elevated prostate specific antigen [PSA]: Secondary | ICD-10-CM | POA: Diagnosis not present

## 2018-10-30 DIAGNOSIS — J309 Allergic rhinitis, unspecified: Secondary | ICD-10-CM | POA: Diagnosis not present

## 2018-10-30 DIAGNOSIS — I10 Essential (primary) hypertension: Secondary | ICD-10-CM | POA: Diagnosis not present

## 2018-10-30 DIAGNOSIS — Z0000 Encounter for general adult medical examination without abnormal findings: Secondary | ICD-10-CM | POA: Diagnosis not present

## 2018-10-30 DIAGNOSIS — Z23 Encounter for immunization: Secondary | ICD-10-CM | POA: Diagnosis not present

## 2018-11-05 ENCOUNTER — Other Ambulatory Visit: Payer: Self-pay | Admitting: Orthopaedic Surgery

## 2018-11-05 DIAGNOSIS — M2042 Other hammer toe(s) (acquired), left foot: Secondary | ICD-10-CM | POA: Diagnosis not present

## 2018-11-05 DIAGNOSIS — M2041 Other hammer toe(s) (acquired), right foot: Secondary | ICD-10-CM | POA: Diagnosis not present

## 2018-11-05 DIAGNOSIS — M2040 Other hammer toe(s) (acquired), unspecified foot: Secondary | ICD-10-CM | POA: Diagnosis not present

## 2018-11-10 ENCOUNTER — Other Ambulatory Visit: Payer: Self-pay

## 2018-11-10 ENCOUNTER — Encounter (HOSPITAL_BASED_OUTPATIENT_CLINIC_OR_DEPARTMENT_OTHER): Payer: Self-pay | Admitting: *Deleted

## 2018-11-17 NOTE — Anesthesia Preprocedure Evaluation (Addendum)
Anesthesia Evaluation  Patient identified by MRN, date of birth, ID band Patient awake    Reviewed: Allergy & Precautions, NPO status , Patient's Chart, lab work & pertinent test results  Airway Mallampati: II  TM Distance: >3 FB Neck ROM: Full    Dental  (+) Teeth Intact, Dental Advisory Given   Pulmonary former smoker,    breath sounds clear to auscultation       Cardiovascular hypertension,  Rhythm:Regular Rate:Normal     Neuro/Psych Anxiety    GI/Hepatic Neg liver ROS, GERD  Medicated,  Endo/Other  negative endocrine ROS  Renal/GU negative Renal ROS     Musculoskeletal negative musculoskeletal ROS (+)   Abdominal Normal abdominal exam  (+)   Peds  Hematology negative hematology ROS (+)   Anesthesia Other Findings   Reproductive/Obstetrics                            Anesthesia Physical Anesthesia Plan  ASA: II  Anesthesia Plan: General   Post-op Pain Management:    Induction: Intravenous  PONV Risk Score and Plan: 3 and Ondansetron, Dexamethasone and Midazolam  Airway Management Planned: LMA  Additional Equipment: None  Intra-op Plan:   Post-operative Plan: Extubation in OR  Informed Consent: I have reviewed the patients History and Physical, chart, labs and discussed the procedure including the risks, benefits and alternatives for the proposed anesthesia with the patient or authorized representative who has indicated his/her understanding and acceptance.     Dental advisory given  Plan Discussed with: CRNA  Anesthesia Plan Comments:        Anesthesia Quick Evaluation

## 2018-11-18 ENCOUNTER — Ambulatory Visit (HOSPITAL_BASED_OUTPATIENT_CLINIC_OR_DEPARTMENT_OTHER): Payer: Medicare HMO | Admitting: Anesthesiology

## 2018-11-18 ENCOUNTER — Ambulatory Visit (HOSPITAL_BASED_OUTPATIENT_CLINIC_OR_DEPARTMENT_OTHER)
Admission: RE | Admit: 2018-11-18 | Discharge: 2018-11-18 | Disposition: A | Discharge status: Home / Self Care | Payer: Medicare HMO | Attending: Orthopaedic Surgery | Admitting: Orthopaedic Surgery

## 2018-11-18 ENCOUNTER — Encounter (HOSPITAL_BASED_OUTPATIENT_CLINIC_OR_DEPARTMENT_OTHER)
Admission: RE | Disposition: A | Discharge status: Home / Self Care | Payer: Self-pay | Source: Home / Self Care | Attending: Orthopaedic Surgery

## 2018-11-18 ENCOUNTER — Other Ambulatory Visit: Payer: Self-pay

## 2018-11-18 ENCOUNTER — Encounter (HOSPITAL_BASED_OUTPATIENT_CLINIC_OR_DEPARTMENT_OTHER): Payer: Self-pay

## 2018-11-18 DIAGNOSIS — M2041 Other hammer toe(s) (acquired), right foot: Secondary | ICD-10-CM | POA: Insufficient documentation

## 2018-11-18 DIAGNOSIS — Z791 Long term (current) use of non-steroidal anti-inflammatories (NSAID): Secondary | ICD-10-CM | POA: Insufficient documentation

## 2018-11-18 DIAGNOSIS — K219 Gastro-esophageal reflux disease without esophagitis: Secondary | ICD-10-CM | POA: Insufficient documentation

## 2018-11-18 DIAGNOSIS — S63212A Subluxation of metacarpophalangeal joint of right middle finger, initial encounter: Secondary | ICD-10-CM | POA: Diagnosis not present

## 2018-11-18 DIAGNOSIS — S93121A Dislocation of metatarsophalangeal joint of right great toe, initial encounter: Secondary | ICD-10-CM | POA: Diagnosis not present

## 2018-11-18 DIAGNOSIS — M7741 Metatarsalgia, right foot: Secondary | ICD-10-CM | POA: Diagnosis not present

## 2018-11-18 DIAGNOSIS — I10 Essential (primary) hypertension: Secondary | ICD-10-CM | POA: Diagnosis not present

## 2018-11-18 DIAGNOSIS — Z885 Allergy status to narcotic agent status: Secondary | ICD-10-CM | POA: Insufficient documentation

## 2018-11-18 DIAGNOSIS — S93324A Dislocation of tarsometatarsal joint of right foot, initial encounter: Secondary | ICD-10-CM | POA: Diagnosis not present

## 2018-11-18 DIAGNOSIS — Z79899 Other long term (current) drug therapy: Secondary | ICD-10-CM | POA: Diagnosis not present

## 2018-11-18 DIAGNOSIS — F419 Anxiety disorder, unspecified: Secondary | ICD-10-CM | POA: Insufficient documentation

## 2018-11-18 DIAGNOSIS — Z87891 Personal history of nicotine dependence: Secondary | ICD-10-CM | POA: Diagnosis not present

## 2018-11-18 DIAGNOSIS — S93141A Subluxation of metatarsophalangeal joint of right great toe, initial encounter: Secondary | ICD-10-CM | POA: Diagnosis not present

## 2018-11-18 DIAGNOSIS — M216X1 Other acquired deformities of right foot: Secondary | ICD-10-CM | POA: Diagnosis not present

## 2018-11-18 DIAGNOSIS — Z882 Allergy status to sulfonamides status: Secondary | ICD-10-CM | POA: Diagnosis not present

## 2018-11-18 DIAGNOSIS — R69 Illness, unspecified: Secondary | ICD-10-CM | POA: Diagnosis not present

## 2018-11-18 DIAGNOSIS — S93144A Subluxation of metatarsophalangeal joint of right lesser toe(s), initial encounter: Secondary | ICD-10-CM | POA: Diagnosis not present

## 2018-11-18 HISTORY — PX: HAMMER TOE SURGERY: SHX385

## 2018-11-18 SURGERY — CORRECTION, HAMMER TOE
Anesthesia: General | Site: Foot | Laterality: Right

## 2018-11-18 MED ORDER — FENTANYL CITRATE (PF) 100 MCG/2ML IJ SOLN
50.0000 ug | INTRAMUSCULAR | Status: DC | PRN
  Administered 2018-11-18: 50 ug via INTRAVENOUS

## 2018-11-18 MED ORDER — PROMETHAZINE HCL 25 MG/ML IJ SOLN: 6.2500 mg | INTRAMUSCULAR | Status: DC | PRN

## 2018-11-18 MED ORDER — FENTANYL CITRATE (PF) 100 MCG/2ML IJ SOLN: 25.0000 ug | INTRAMUSCULAR | Status: DC | PRN

## 2018-11-18 MED ORDER — LACTATED RINGERS IV SOLN
INTRAVENOUS | Status: DC
  Administered 2018-11-18: 07:00:00 via INTRAVENOUS

## 2018-11-18 MED ORDER — PROPOFOL 500 MG/50ML IV EMUL
INTRAVENOUS | Status: AC
  Filled 2018-11-18: qty 50

## 2018-11-18 MED ORDER — SCOPOLAMINE 1 MG/3DAYS TD PT72: 1.0000 | MEDICATED_PATCH | Freq: Once | TRANSDERMAL | Status: DC | PRN

## 2018-11-18 MED ORDER — LACTATED RINGERS IV SOLN: INTRAVENOUS | Status: DC

## 2018-11-18 MED ORDER — LIDOCAINE HCL (CARDIAC) PF 100 MG/5ML IV SOSY
PREFILLED_SYRINGE | INTRAVENOUS | Status: DC | PRN
  Administered 2018-11-18: 60 mg via INTRAVENOUS

## 2018-11-18 MED ORDER — CEFAZOLIN SODIUM-DEXTROSE 2-3 GM-%(50ML) IV SOLR
INTRAVENOUS | Status: DC | PRN
  Administered 2018-11-18: 2 g via INTRAVENOUS

## 2018-11-18 MED ORDER — MIDAZOLAM HCL 2 MG/2ML IJ SOLN
INTRAMUSCULAR | Status: AC
  Filled 2018-11-18: qty 2

## 2018-11-18 MED ORDER — OXYCODONE HCL 5 MG PO TABS: 5.0000 mg | ORAL_TABLET | ORAL | 0 refills | Status: DC | PRN

## 2018-11-18 MED ORDER — EPHEDRINE SULFATE 50 MG/ML IJ SOLN
INTRAMUSCULAR | Status: DC | PRN
  Administered 2018-11-18: 15 mg via INTRAVENOUS
  Administered 2018-11-18 (×2): 10 mg via INTRAVENOUS
  Administered 2018-11-18: 15 mg via INTRAVENOUS

## 2018-11-18 MED ORDER — BUPIVACAINE HCL 0.5 % IJ SOLN
INTRAMUSCULAR | Status: DC | PRN
  Administered 2018-11-18: 20 mL

## 2018-11-18 MED ORDER — CEFAZOLIN SODIUM-DEXTROSE 2-4 GM/100ML-% IV SOLN: 2.0000 g | INTRAVENOUS | Status: DC

## 2018-11-18 MED ORDER — ONDANSETRON HCL 4 MG/2ML IJ SOLN
INTRAMUSCULAR | Status: AC
  Filled 2018-11-18: qty 2

## 2018-11-18 MED ORDER — CHLORHEXIDINE GLUCONATE 4 % EX LIQD: 60.0000 mL | Freq: Once | CUTANEOUS | Status: DC

## 2018-11-18 MED ORDER — ACETAMINOPHEN 10 MG/ML IV SOLN: 1000.0000 mg | Freq: Once | INTRAVENOUS | Status: DC | PRN

## 2018-11-18 MED ORDER — LIDOCAINE HCL (PF) 1 % IJ SOLN
INTRAMUSCULAR | Status: AC
  Filled 2018-11-18: qty 30

## 2018-11-18 MED ORDER — ONDANSETRON HCL 4 MG/2ML IJ SOLN
INTRAMUSCULAR | Status: DC | PRN
  Administered 2018-11-18: 4 mg via INTRAVENOUS

## 2018-11-18 MED ORDER — LIDOCAINE-EPINEPHRINE (PF) 1 %-1:200000 IJ SOLN
INTRAMUSCULAR | Status: AC
  Filled 2018-11-18: qty 30

## 2018-11-18 MED ORDER — DEXAMETHASONE SODIUM PHOSPHATE 10 MG/ML IJ SOLN
INTRAMUSCULAR | Status: DC | PRN
  Administered 2018-11-18: 10 mg via INTRAVENOUS

## 2018-11-18 MED ORDER — MIDAZOLAM HCL 2 MG/2ML IJ SOLN
1.0000 mg | INTRAMUSCULAR | Status: DC | PRN
  Administered 2018-11-18: 1 mg via INTRAVENOUS

## 2018-11-18 MED ORDER — FENTANYL CITRATE (PF) 100 MCG/2ML IJ SOLN
INTRAMUSCULAR | Status: AC
  Filled 2018-11-18: qty 2

## 2018-11-18 MED ORDER — BUPIVACAINE HCL (PF) 0.5 % IJ SOLN
INTRAMUSCULAR | Status: AC
  Filled 2018-11-18: qty 30

## 2018-11-18 MED ORDER — DEXAMETHASONE SODIUM PHOSPHATE 10 MG/ML IJ SOLN
INTRAMUSCULAR | Status: AC
  Filled 2018-11-18: qty 1

## 2018-11-18 MED ORDER — PROPOFOL 10 MG/ML IV BOLUS
INTRAVENOUS | Status: DC | PRN
  Administered 2018-11-18: 150 mg via INTRAVENOUS

## 2018-11-18 MED ORDER — CEFAZOLIN SODIUM-DEXTROSE 2-4 GM/100ML-% IV SOLN
INTRAVENOUS | Status: AC
  Filled 2018-11-18: qty 100

## 2018-11-18 MED ORDER — HYDROCODONE-ACETAMINOPHEN 5-325 MG PO TABS: 1.0000 | ORAL_TABLET | ORAL | 0 refills | Status: AC | PRN

## 2018-11-18 SURGICAL SUPPLY — 70 items
BANDAGE ACE 4X5 VEL STRL LF (GAUZE/BANDAGES/DRESSINGS) ×2 IMPLANT
BANDAGE ACE 6X5 VEL STRL LF (GAUZE/BANDAGES/DRESSINGS) ×2 IMPLANT
BANDAGE ESMARK 6X9 LF (GAUZE/BANDAGES/DRESSINGS) IMPLANT
BENZOIN TINCTURE PRP APPL 2/3 (GAUZE/BANDAGES/DRESSINGS) IMPLANT
BLADE LONG MED 25X9 (BLADE) IMPLANT
BLADE OSC/SAG .038X5.5 CUT EDG (BLADE) ×2 IMPLANT
BLADE PRESCISION 7.0X.51X18.5 (BLADE) ×2 IMPLANT
BLADE SURG 15 STRL LF DISP TIS (BLADE) ×4 IMPLANT
BLADE SURG 15 STRL SS (BLADE) ×4
BNDG COHESIVE 4X5 TAN STRL (GAUZE/BANDAGES/DRESSINGS) IMPLANT
BNDG CONFORM 2 STRL LF (GAUZE/BANDAGES/DRESSINGS) IMPLANT
BNDG ESMARK 4X9 LF (GAUZE/BANDAGES/DRESSINGS) ×2 IMPLANT
BNDG ESMARK 6X9 LF (GAUZE/BANDAGES/DRESSINGS)
CHLORAPREP W/TINT 26 (MISCELLANEOUS) ×2 IMPLANT
COVER BACK TABLE 60X90IN (DRAPES) IMPLANT
COVER WAND RF STERILE (DRAPES) IMPLANT
CUFF TOURN SGL QUICK 34 (TOURNIQUET CUFF)
CUFF TRNQT CYL 34X4.125X (TOURNIQUET CUFF) IMPLANT
DECANTER SPIKE VIAL GLASS SM (MISCELLANEOUS) IMPLANT
DRAPE EXTREMITY T 121X128X90 (DISPOSABLE) ×2 IMPLANT
DRAPE IMP U-DRAPE 54X76 (DRAPES) IMPLANT
DRAPE OEC MINIVIEW 54X84 (DRAPES) ×2 IMPLANT
DRAPE U-SHAPE 47X51 STRL (DRAPES) ×2 IMPLANT
ELECT REM PT RETURN 9FT ADLT (ELECTROSURGICAL) ×2
ELECTRODE REM PT RTRN 9FT ADLT (ELECTROSURGICAL) ×1 IMPLANT
GAUZE SPONGE 4X4 12PLY STRL (GAUZE/BANDAGES/DRESSINGS) ×2 IMPLANT
GAUZE XEROFORM 1X8 LF (GAUZE/BANDAGES/DRESSINGS) ×2 IMPLANT
GLOVE BIO SURGEON STRL SZ 6.5 (GLOVE) ×2 IMPLANT
GLOVE BIO SURGEON STRL SZ7.5 (GLOVE) ×2 IMPLANT
GLOVE BIOGEL PI IND STRL 7.0 (GLOVE) ×2 IMPLANT
GLOVE BIOGEL PI IND STRL 8 (GLOVE) ×1 IMPLANT
GLOVE BIOGEL PI INDICATOR 7.0 (GLOVE) ×2
GLOVE BIOGEL PI INDICATOR 8 (GLOVE) ×1
GOWN STRL REUS W/ TWL LRG LVL3 (GOWN DISPOSABLE) ×1 IMPLANT
GOWN STRL REUS W/ TWL XL LVL3 (GOWN DISPOSABLE) ×1 IMPLANT
GOWN STRL REUS W/TWL LRG LVL3 (GOWN DISPOSABLE) ×1
GOWN STRL REUS W/TWL XL LVL3 (GOWN DISPOSABLE) ×1
K-WIRE .035X4 (WIRE) ×4 IMPLANT
K-WIRE .045X4 (WIRE) ×4 IMPLANT
NEEDLE HYPO 22GX1.5 SAFETY (NEEDLE) IMPLANT
NS IRRIG 1000ML POUR BTL (IV SOLUTION) ×2 IMPLANT
PACK ARTHROSCOPY DSU (CUSTOM PROCEDURE TRAY) ×2 IMPLANT
PACK BASIN DAY SURGERY FS (CUSTOM PROCEDURE TRAY) ×2 IMPLANT
PAD CAST 4YDX4 CTTN HI CHSV (CAST SUPPLIES) IMPLANT
PADDING CAST COTTON 4X4 STRL (CAST SUPPLIES)
PADDING CAST SYNTHETIC 4 (CAST SUPPLIES) ×1
PADDING CAST SYNTHETIC 4X4 STR (CAST SUPPLIES) ×1 IMPLANT
PENCIL BUTTON HOLSTER BLD 10FT (ELECTRODE) ×2 IMPLANT
SCREW CORTEX ST 2.0X16 (Screw) ×2 IMPLANT
SLEEVE SCD COMPRESS KNEE MED (MISCELLANEOUS) ×2 IMPLANT
SPLINT FIBERGLASS 4X30 (CAST SUPPLIES) IMPLANT
SPONGE LAP 18X18 RF (DISPOSABLE) IMPLANT
STOCKINETTE 6  STRL (DRAPES) ×1
STOCKINETTE 6 STRL (DRAPES) ×1 IMPLANT
STRIP CLOSURE SKIN 1/2X4 (GAUZE/BANDAGES/DRESSINGS) IMPLANT
SUCTION FRAZIER HANDLE 10FR (MISCELLANEOUS) ×1
SUCTION TUBE FRAZIER 10FR DISP (MISCELLANEOUS) ×1 IMPLANT
SUT ETHILON 3 0 PS 1 (SUTURE) ×4 IMPLANT
SUT FIBERWIRE 2-0 18 17.9 3/8 (SUTURE) ×2
SUT MNCRL AB 3-0 PS2 18 (SUTURE) ×2 IMPLANT
SUT PDS AB 2-0 CT2 27 (SUTURE) ×2 IMPLANT
SUT VIC AB 2-0 SH 27 (SUTURE)
SUT VIC AB 2-0 SH 27XBRD (SUTURE) IMPLANT
SUT VIC AB 3-0 FS2 27 (SUTURE) IMPLANT
SUTURE FIBERWR 2-0 18 17.9 3/8 (SUTURE) ×1 IMPLANT
SYR BULB 3OZ (MISCELLANEOUS) ×2 IMPLANT
SYR CONTROL 10ML LL (SYRINGE) IMPLANT
TOWEL GREEN STERILE FF (TOWEL DISPOSABLE) ×4 IMPLANT
TUBE CONNECTING 20X1/4 (TUBING) IMPLANT
UNDERPAD 30X30 (UNDERPADS AND DIAPERS) IMPLANT

## 2018-11-18 NOTE — Anesthesia Postprocedure Evaluation (Signed)
Anesthesia Post Note  Patient: Cesar Wilson  Procedure(s) Performed: RIGHT FOOT 2ND AND 3RD HAMMER TOE CORRECTION (Right Foot)     Patient location during evaluation: PACU Anesthesia Type: General Level of consciousness: awake and alert Pain management: pain level controlled Vital Signs Assessment: post-procedure vital signs reviewed and stable Respiratory status: spontaneous breathing, nonlabored ventilation, respiratory function stable and patient connected to nasal cannula oxygen Cardiovascular status: blood pressure returned to baseline and stable Postop Assessment: no apparent nausea or vomiting Anesthetic complications: no    Last Vitals:  Vitals:   11/18/18 0958 11/18/18 1029  BP:  (!) 147/77  Pulse: 64 (!) 55  Resp: 14 16  Temp:  (!) 36.4 C  SpO2: 100%     Last Pain:  Vitals:   11/18/18 1029  TempSrc: Oral  PainSc: 0-No pain                 [name]

## 2018-11-18 NOTE — Anesthesia Procedure Notes (Signed)
Procedure Name: LMA Insertion Performed by: Misk Galentine M, CRNA Pre-anesthesia Checklist: Patient identified, Emergency Drugs available, Suction available, Patient being monitored and Timeout performed Patient Re-evaluated:Patient Re-evaluated prior to induction Oxygen Delivery Method: Circle system utilized Preoxygenation: Pre-oxygenation with 100% oxygen Induction Type: IV induction Ventilation: Mask ventilation without difficulty LMA: LMA inserted LMA Size: 4.0 Number of attempts: 1 Placement Confirmation: positive ETCO2,  CO2 detector and breath sounds checked- equal and bilateral Tube secured with: Tape Dental Injury: Teeth and Oropharynx as per pre-operative assessment        

## 2018-11-18 NOTE — Transfer of Care (Signed)
Immediate Anesthesia Transfer of Care Note  Patient: Cesar Wilson  Procedure(s) Performed: RIGHT FOOT 2ND AND 3RD HAMMER TOE CORRECTION (Right Foot)  Patient Location: PACU  Anesthesia Type:General  Level of Consciousness: awake, alert  and oriented  Airway & Oxygen Therapy: Patient Spontanous Breathing and Patient connected to face mask oxygen  Post-op Assessment: Report given to RN and Post -op Vital signs reviewed and stable  Post vital signs: Reviewed and stable  Last Vitals:  Vitals Value Taken Time  BP    Temp    Pulse 78 11/18/2018  9:13 AM  Resp    SpO2 100 % 11/18/2018  9:13 AM  Vitals shown include unvalidated device data.  Last Pain:  Vitals:   11/18/18 0643  TempSrc: Oral  PainSc: 0-No pain         Complications: No apparent anesthesia complications

## 2018-11-18 NOTE — H&P (Signed)
Cesar Wilson is an 68 y.o. male.   Chief Complaint: Right second and third hammertoes with metatarsalgia HPI: Patient recently underwent second third hammertoe correction on the left foot and did well.  He is here for correction of the second and third hammertoes on the right foot.  These are less severe but they do cause significant discomfort.  He has difficulty with shoe wear due to the prominence at the PIP joint of the second toe as well as some pain in the plantar aspect of his forefoot.  He understands the risk, benefits and alternatives to surgery as he recently went through this procedure.  Today's here for surgery would like to proceed.  No complaints today.  Past Medical History:  Diagnosis Date  . Anxiety   . Biceps tendon rupture    left distal  . GERD (gastroesophageal reflux disease)   . Hammer toe of left foot    2nd and 3rd toes  . Hypertension    under control; has been on med. x 23 yrs.    Past Surgical History:  Procedure Laterality Date  . DISTAL BICEPS TENDON REPAIR  08/06/2011   Procedure: DISTAL BICEPS TENDON REPAIR;  Surgeon: Mable Paris, MD;  Location: [facility];  Service: Orthopedics;  Laterality: Left;  . HAMMERTOE RECONSTRUCTION WITH WEIL OSTEOTOMY Left 09/23/2018   Procedure: LEFT SECOND AND THIRD HAMMER TOE CORRECTION  WITH WEIL OSTEOTOMY LEFT SECOND;  Surgeon: Terance Hart, MD;  Location: [facility];  Service: Orthopedics;  Laterality: Left;  . ORIF ANKLE FRACTURE  12/08/2009   left lat. malleolus  . TONSILLECTOMY AND ADENOIDECTOMY  as a child    History reviewed. No pertinent family history. Social History:  reports that he has quit smoking. He has never used smokeless tobacco. He reports that he does not drink alcohol or use drugs.  Allergies:  Allergies  Allergen Reactions  . Sulfa Antibiotics Hives  . Percocet [Oxycodone-Acetaminophen] Other (See Comments)    Keeps him awake, and does not relieve  pain well    Medications Prior to Admission  Medication Sig Dispense Refill  . Ascorbic Acid (VITAMIN C) 1000 MG tablet Take 1,000 mg by mouth daily.    . cholecalciferol (VITAMIN D) 1000 UNITS tablet Take 1,000 Units by mouth daily. Vit. D3    . clonazePAM (KLONOPIN) 1 MG tablet Take 2 mg by mouth at bedtime.    Marland Kitchen diltiazem (TIAZAC) 240 MG 24 hr capsule Take 240 mg by mouth daily.    . fluticasone (FLONASE) 50 MCG/ACT nasal spray Place into both nostrils daily.    Marland Kitchen ibuprofen (ADVIL,MOTRIN) 400 MG tablet Take 400 mg by mouth every 6 (six) hours as needed.    . metoprolol succinate (TOPROL-XL) 50 MG 24 hr tablet Take 50 mg by mouth daily. Take with or immediately following a meal.    . Multiple Vitamins-Minerals (MULTIVITAMINS THER. W/MINERALS) TABS Take 1 tablet by mouth daily.     . naproxen (NAPROSYN) 500 MG tablet Take 500 mg by mouth 2 (two) times daily with a meal.    . omeprazole (PRILOSEC) 20 MG capsule Take 20 mg by mouth daily.    . rizatriptan (MAXALT) 10 MG tablet Take 10 mg by mouth as needed for migraine. May repeat in 2 hours if needed      No results found for this or any previous visit (from the past 48 hour(s)). No results found.  Review of Systems  Constitutional: Negative.  HENT: Negative.   Eyes: Negative.   Respiratory: Negative.   Cardiovascular: Negative.   Gastrointestinal: Negative.   Musculoskeletal:       Right 2 and 3 hammer toes with pain  Skin: Negative.   Neurological: Negative.   Psychiatric/Behavioral: Negative.     Blood pressure (!) 176/91, pulse (!) 56, temperature 97.9 F (36.6 C), temperature source Oral, resp. rate 18, height 5\' 10"  (1.778 m), weight 89.5 kg, SpO2 99 %. Physical Exam  Constitutional: He appears well-developed.  HENT:  Head: Normocephalic.  Eyes: Conjunctivae are normal.  Neck: Neck supple.  Cardiovascular: Normal rate.  Respiratory: Effort normal.  GI: Soft.  Musculoskeletal:     Comments: Right foot demonstrates  second and third hammertoes.  He has fixed PIP flexion contracture with overlying callus and skin irritation of the second toe.  10 degree fixed PIP flexion contracture of the third toe.  Tenderness to palpation under the plantar forefoot.  Notably under the second and third metatarsal head region.  Motor intact.  Sensation intact.  Palpable dorsalis pedis pulse.  Neurological: He is alert.  Skin: Skin is warm.  Psychiatric: He has a normal mood and affect.     Assessment/Plan We will proceed with hammertoe correction of the second and third toes.  This is on the right side.  Will likely need a Weil osteotomy of the second given the chronic subluxation dorsally.  He understands he will have pins in his toes for 6 weeks and understands the weightbearing restrictions.  He also understands the risk, benefits and alternatives of surgery which were discussed previously but include and not limited to wound healing complications, infection, continued pain, malunion, nonunion, need for further surgery, demonstrating structures, recurrence and floating toe.  After weighing these risks he like to proceed.  Terance Hart, MD 11/18/2018, 7:07 AM

## 2018-11-18 NOTE — Discharge Instructions (Signed)
DR. Susa Simmonds FOOT & ANKLE SURGERY POST-OP INSTRUCTIONS   Pain Management 1. The numbing medicine and your leg will last around 8 hours, take a dose of your pain medicine as soon as you feel it wearing off to avoid rebound pain. 2. Keep your foot elevated above heart level.  Make sure that your heel hangs free ('floats'). 3. Take all prescribed medication as directed. 4. If taking narcotic pain medication you may want to use an over-the-counter stool softener to avoid constipation. 5. You may take over-the-counter NSAIDs (ibuprofen, naproxen, etc.) as well as over-the-counter acetaminophen as directed on the packaging as a supplement for your pain and may also use it to wean away from the prescription medication.  Activity ? Heel weightbearing as tolerated in a post op shoe   First Postoperative Visit 1. Your first postop visit will be at least 2 weeks after surgery.  This should be scheduled when you schedule surgery. 2. If you do not have a postoperative visit scheduled please call [phone] to schedule an appointment. 3. At the appointment your incision will be evaluated for suture removal, x-rays will be obtained if necessary.  General Instructions 1. Swelling is very common after foot and ankle surgery.  It often takes 3 months for the foot and ankle to begin to feel comfortable.  Some amount of swelling will persist for 6-12 months. 2. DO NOT change the dressing.  If there is a problem with the dressing (too tight, loose, gets wet, etc.) please contact Dr. Donnie Mesa office. 3. DO NOT get the dressing wet.  For showers you can use an over-the-counter cast cover or wrap a washcloth around the top of your dressing and then cover it with a plastic bag and tape it to your leg. 4. DO NOT soak the incision (no tubs, pools, bath, etc.) until you have approval from Dr. Susa Simmonds.  Contact Dr. Garret Reddish office or go to Emergency Room if: 1. Temperature above 101 F. 2. Increasing pain that is  unresponsive to pain medication or elevation 3. Excessive redness or swelling in your foot 4. Dressing problems - excessive bloody drainage, looseness or tightness, or if dressing gets wet 5. Develop pain, swelling, warmth, or discoloration of your calf   Post Anesthesia Home Care Instructions  Activity: Get plenty of rest for the remainder of the day. A responsible individual must stay with you for 24 hours following the procedure.  For the next 24 hours, DO NOT: -Drive a car -Advertising copywriter -Drink alcoholic beverages -Take any medication unless instructed by your physician -Make any legal decisions or sign important papers.  Meals: Start with liquid foods such as gelatin or soup. Progress to regular foods as tolerated. Avoid greasy, spicy, heavy foods. If nausea and/or vomiting occur, drink only clear liquids until the nausea and/or vomiting subsides. Call your physician if vomiting continues.  Special Instructions/Symptoms: Your throat may feel dry or sore from the anesthesia or the breathing tube placed in your throat during surgery. If this causes discomfort, gargle with warm salt water. The discomfort should disappear within 24 hours.  If you had a scopolamine patch placed behind your ear for the management of post- operative nausea and/or vomiting:  1. The medication in the patch is effective for 72 hours, after which it should be removed.  Wrap patch in a tissue and discard in the trash. Wash hands thoroughly with soap and water. 2. You may remove the patch earlier than 72 hours if you experience unpleasant side effects which  may include dry mouth, dizziness or visual disturbances. 3. Avoid touching the patch. Wash your hands with soap and water after contact with the patch.

## 2018-11-18 NOTE — Op Note (Signed)
Cesar Wilson Few male 68 y.o. 11/18/2018  PreOperative Diagnosis: Right second hammertoe with MTP dislocation Right third hammertoe with MTP subluxation Right metatarsalgia   PostOperative Diagnosis: Same  PROCEDURE: Right second hammertoe correction with PIP arthroplasty Right third hammertoe correction with PIP arthroplasty Right second MTP capsulotomy Right third MTP capsulotomy Open treatment of right MTP dislocation Right second and third extensor tendon lengthening Right second and third toe flexor tenotomy Right second metatarsal Weil osteotomy Right FDL to EDL tendon transfer, deep.  SURGEON: Dub Mikes, MD  ASSISTANT: None  ANESTHESIA: General LMA with local anesthetic using half percent Marcaine 20 cc  FINDINGS: Right second hammertoe with fixed 90 degree PIP flexion contracture and MTP joint dislocation Right third hammertoe with 50 degree PIP flexion contracture, fixed with MTP subluxation   IMPLANTS: 2.0 mm mini frag screw 2-0 FiberWire stitch 4 5 and 3 5 K wires  INDICATIONS:67 y.o. male had longstanding second third hammertoe of the right foot.  He had fixed PIP flexion contractures and overlying callus and skin irritation at the PIP joints of the second third toes.  He had metatarsalgia symptoms on the plantar forefoot.  He recently underwent second and third hammertoe correction on the left foot and did well from this and wished to proceed with hammertoe correction on the right foot.  We discussed the risk, benefits and alternatives of surgery which included but were not limited to wound healing complications, infection, nonunion, malunion, need for further surgery, floating toe, continued pain.  We also discussed demonstrating structures in the perioperative and anesthetic risk which included death.  He understood the weightbearing restrictions and wish to proceed.  PROCEDURE: Patient was identified in the preoperative holding area.  The right lower  extremity was marked by myself.  The consent was signed by myself and the patient.  He was taken to the operative suite and placed supine on the operative table.  General LMA anesthesia was induced without difficulty.  A surgical timeout was performed for the block.  20 cc of half percent Marcaine plain were injected in and around the second and third toes in a digital block fashion.  Then preoperative antibiotics were given.  The right lower extremity was prepped and draped in the usual sterile fashion.  A bump was placed under the right hip prior to draping and all bony prominences were well-padded.  Surgical timeout was reperformed.  Began by putting a 4 inch Esmarch tourniquet around the ankle for hemostasis.  We then made a longitudinal incision overlying the second PIP joint down proximal to the crease in a curvilinear fashion over the second and third MTP joints.  This taken sharply down through skin subcutaneous tissue.  The superficial veins were identified and cauterized.  The extensor digitorum longus tendon to the second toe was identified and tenotomized distally.  The extensor digitorum brevis tendon to the second toe was identified and tenotomized proximally.  These were given to be repaired later.  Then the capsule of the MTP joint was identified and the joint was found to be dislocated dorsally.  Capsular incision transversely was made overlying the MTP joint.  Then the collateral ligaments laterally medially were incised to allow attempted reduction of the MTP joint.  There was still difficulty and getting the MTP joint of the second toe reduced and therefore an osteotomy was performed of the distal metatarsal and a Weil fashion.  This was then fixed with a 2 oh millimeter screw and confirmed on fluoroscopy to have acceptable  screw placement and shortening of the metatarsal.  It remained longer than the third metatarsal.  The joint was then able to reduce more easily.  Then turned my attention to  the third MTP joint.  The extensor digitorum longus tendon to the third toe was identified and mobilized.  This was tenotomized distally.  Then the extensor digitorum brevis tendon to the third toe was identified and tenotomized proximally.  Then the capsule overlying the third MTP joint was identified and incised longitudinally.  There is dorsal subluxation of the third MTP joint.  The collateral ligaments medially and laterally were incised at the joint level and the joint was able to reduce after some soft tissue releases dorsally.  No Weil osteotomy was performed of the third toe.  We then turned our attention to the PIP joint of the second toe.  An elliptical incision was made through the dorsal tendinous and capsular tissue.  This was used to identify the PIP joint which was fixed in a 90 degree flexion contracture.  The collateral ligaments were released.  Then turned my attention to the third toe.  A longitudinal incision was made overlying the PIP joint.  This taken sharply down through skin and subcutaneous tissue.  This was a separate incision.  Then skin flaps were created medially and laterally.  An elliptical incision was made overlying the PIP joint through the tendinous and capsular tissue to identify the joint.  The collateral ligaments medially and laterally were incised.  Then using a sagittal saw the distal aspect of the proximal phalanx and the proximal aspect of the distal phalanx of the third toe was removed.  This was then completed for the second toe.  Then using a hemostat the flexor tendons underneath the PIP joint of the second third toe were identified and tenotomized.  This was performed the flexor tenotomy portion of the case.  Then the PIP arthrodesis site was inspected to ensure good alignment of the toes.  Then a 045 K wire was placed in the middle phalanx of the second toe in an anterograde fashion.  Then a second 3 5 K wire was placed in a similar fashion next to the 4 5.  Then  the same was performed for the third toe.  Then the PIP joint was reduced of the second toe and the 4 5 K wire was advanced in a retrograde fashion to the base of the proximal phalanx.  Then the 4 5 K wire was advanced as well.  Then we repeated this for the third toe.  Bony prominences about the PIP arthrodesis site were inspected and a rondure was used to remove bony prominences then the MTP joint of the second toe was reduced and using a 2-0 FiberWire stitch the lateral capsular and plantar plate tissue was imbricated to hold the toe down into place and to perform the open treatment of the dislocated MTP joint.  Then the 4 5 K wire was advanced across the MTP joint with the toe reduced.  This was then performed for the third toe without the tissue imbrication portion.  Then appropriate K wire position and reduction of the toes were confirmed on fluoroscopy.  We then irrigated the wound copiously with normal saline.  The tourniquet was released prior to reduction of the MTP joints.  Then hemostasis was obtained.  The extensor digitorum longus tendon was transferred to the extensor digitorum brevis tendon of the second and third toes using it 2-0 PDS suture.  Then  the deep tissue was closed with 3-0 Monocryl and the skin with 3-0 nylon.  Monocryl, 4 x 4's and sterile she cotton was placed.  4 inch Ace wrap was placed.  Then Jurgens balls were placed on the 4 5 K wires and 3 5 K wires were bent.  Counts were correct at the end of the case.  There were no complications.  He tolerated this well.  He was awakened from recovery and taken to anesthesia in stable condition.  POST OPERATIVE INSTRUCTIONS: Heel weightbearing right lower extremity He will be given a postoperative shoe Call the office with concerns No need for DVT prophylaxis He will follow-up in 2 weeks for wound check, suture removal and nonweightbearing x-rays.  Pins will be in place for 6 weeks.  TOURNIQUET TIME: Unknown  BLOOD LOSS:  less  than 50 mL         DRAINS: none         SPECIMEN: none       COMPLICATIONS:  * No complications entered in OR log *         Disposition: PACU - hemodynamically stable.         Condition: stable

## 2018-11-19 ENCOUNTER — Encounter (HOSPITAL_BASED_OUTPATIENT_CLINIC_OR_DEPARTMENT_OTHER): Payer: Self-pay | Admitting: Orthopaedic Surgery

## 2018-12-03 DIAGNOSIS — M2041 Other hammer toe(s) (acquired), right foot: Secondary | ICD-10-CM | POA: Diagnosis not present

## 2018-12-09 DIAGNOSIS — N183 Chronic kidney disease, stage 3 (moderate): Secondary | ICD-10-CM | POA: Diagnosis not present

## 2018-12-09 DIAGNOSIS — N289 Disorder of kidney and ureter, unspecified: Secondary | ICD-10-CM | POA: Diagnosis not present

## 2018-12-31 DIAGNOSIS — M2041 Other hammer toe(s) (acquired), right foot: Secondary | ICD-10-CM | POA: Diagnosis not present

## 2019-02-09 DIAGNOSIS — M2041 Other hammer toe(s) (acquired), right foot: Secondary | ICD-10-CM | POA: Diagnosis not present

## 2019-02-11 DIAGNOSIS — M25559 Pain in unspecified hip: Secondary | ICD-10-CM | POA: Diagnosis not present

## 2019-04-08 DIAGNOSIS — M180 Bilateral primary osteoarthritis of first carpometacarpal joints: Secondary | ICD-10-CM | POA: Diagnosis not present

## 2019-04-08 DIAGNOSIS — M19049 Primary osteoarthritis, unspecified hand: Secondary | ICD-10-CM | POA: Diagnosis not present

## 2019-04-08 DIAGNOSIS — M1811 Unilateral primary osteoarthritis of first carpometacarpal joint, right hand: Secondary | ICD-10-CM | POA: Diagnosis not present

## 2019-07-09 DIAGNOSIS — R972 Elevated prostate specific antigen [PSA]: Secondary | ICD-10-CM | POA: Diagnosis not present

## 2019-07-16 DIAGNOSIS — R972 Elevated prostate specific antigen [PSA]: Secondary | ICD-10-CM | POA: Diagnosis not present

## 2019-07-16 DIAGNOSIS — N5201 Erectile dysfunction due to arterial insufficiency: Secondary | ICD-10-CM | POA: Diagnosis not present

## 2019-07-16 DIAGNOSIS — N400 Enlarged prostate without lower urinary tract symptoms: Secondary | ICD-10-CM | POA: Diagnosis not present

## 2019-07-27 DIAGNOSIS — R69 Illness, unspecified: Secondary | ICD-10-CM | POA: Diagnosis not present

## 2019-11-23 DIAGNOSIS — I10 Essential (primary) hypertension: Secondary | ICD-10-CM | POA: Diagnosis not present

## 2019-11-23 DIAGNOSIS — J019 Acute sinusitis, unspecified: Secondary | ICD-10-CM | POA: Diagnosis not present

## 2019-12-30 DIAGNOSIS — M79641 Pain in right hand: Secondary | ICD-10-CM | POA: Diagnosis not present

## 2019-12-30 DIAGNOSIS — M79642 Pain in left hand: Secondary | ICD-10-CM | POA: Diagnosis not present

## 2020-05-15 DIAGNOSIS — M545 Low back pain: Secondary | ICD-10-CM | POA: Diagnosis not present

## 2020-06-04 DIAGNOSIS — R69 Illness, unspecified: Secondary | ICD-10-CM | POA: Diagnosis not present

## 2020-06-17 DIAGNOSIS — Z20828 Contact with and (suspected) exposure to other viral communicable diseases: Secondary | ICD-10-CM | POA: Diagnosis not present

## 2020-06-20 DIAGNOSIS — M79642 Pain in left hand: Secondary | ICD-10-CM | POA: Diagnosis not present

## 2020-06-20 DIAGNOSIS — M79641 Pain in right hand: Secondary | ICD-10-CM | POA: Diagnosis not present

## 2020-07-19 DIAGNOSIS — R972 Elevated prostate specific antigen [PSA]: Secondary | ICD-10-CM | POA: Diagnosis not present

## 2020-07-21 DIAGNOSIS — N401 Enlarged prostate with lower urinary tract symptoms: Secondary | ICD-10-CM | POA: Diagnosis not present

## 2020-07-21 DIAGNOSIS — R972 Elevated prostate specific antigen [PSA]: Secondary | ICD-10-CM | POA: Diagnosis not present

## 2020-07-21 DIAGNOSIS — R351 Nocturia: Secondary | ICD-10-CM | POA: Diagnosis not present

## 2020-07-21 DIAGNOSIS — N5201 Erectile dysfunction due to arterial insufficiency: Secondary | ICD-10-CM | POA: Diagnosis not present

## 2020-08-04 DIAGNOSIS — M1811 Unilateral primary osteoarthritis of first carpometacarpal joint, right hand: Secondary | ICD-10-CM | POA: Diagnosis not present

## 2020-08-04 DIAGNOSIS — M180 Bilateral primary osteoarthritis of first carpometacarpal joints: Secondary | ICD-10-CM | POA: Diagnosis not present

## 2020-08-04 DIAGNOSIS — M1812 Unilateral primary osteoarthritis of first carpometacarpal joint, left hand: Secondary | ICD-10-CM | POA: Diagnosis not present

## 2020-08-11 DIAGNOSIS — Z1211 Encounter for screening for malignant neoplasm of colon: Secondary | ICD-10-CM | POA: Diagnosis not present

## 2020-08-11 DIAGNOSIS — K219 Gastro-esophageal reflux disease without esophagitis: Secondary | ICD-10-CM | POA: Diagnosis not present

## 2020-08-11 DIAGNOSIS — G479 Sleep disorder, unspecified: Secondary | ICD-10-CM | POA: Diagnosis not present

## 2020-08-11 DIAGNOSIS — J309 Allergic rhinitis, unspecified: Secondary | ICD-10-CM | POA: Diagnosis not present

## 2020-08-11 DIAGNOSIS — I10 Essential (primary) hypertension: Secondary | ICD-10-CM | POA: Diagnosis not present

## 2020-08-11 DIAGNOSIS — Z0000 Encounter for general adult medical examination without abnormal findings: Secondary | ICD-10-CM | POA: Diagnosis not present

## 2020-08-11 DIAGNOSIS — R972 Elevated prostate specific antigen [PSA]: Secondary | ICD-10-CM | POA: Diagnosis not present

## 2020-08-18 DIAGNOSIS — Z1211 Encounter for screening for malignant neoplasm of colon: Secondary | ICD-10-CM | POA: Diagnosis not present

## 2020-09-03 DIAGNOSIS — X58XXXA Exposure to other specified factors, initial encounter: Secondary | ICD-10-CM | POA: Diagnosis not present

## 2020-09-03 DIAGNOSIS — S39012A Strain of muscle, fascia and tendon of lower back, initial encounter: Secondary | ICD-10-CM | POA: Diagnosis not present

## 2020-11-04 DIAGNOSIS — R519 Headache, unspecified: Secondary | ICD-10-CM | POA: Diagnosis not present

## 2020-11-08 ENCOUNTER — Encounter: Payer: Self-pay | Admitting: Neurology

## 2020-11-08 ENCOUNTER — Ambulatory Visit: Payer: Medicare HMO | Admitting: Neurology

## 2020-11-08 ENCOUNTER — Other Ambulatory Visit: Payer: Self-pay

## 2020-11-08 ENCOUNTER — Telehealth: Payer: Self-pay | Admitting: Neurology

## 2020-11-08 VITALS — BP 195/98 | HR 56 | Ht 70.0 in | Wt 205.0 lb

## 2020-11-08 DIAGNOSIS — R519 Headache, unspecified: Secondary | ICD-10-CM | POA: Diagnosis not present

## 2020-11-08 DIAGNOSIS — R7989 Other specified abnormal findings of blood chemistry: Secondary | ICD-10-CM | POA: Diagnosis not present

## 2020-11-08 DIAGNOSIS — E079 Disorder of thyroid, unspecified: Secondary | ICD-10-CM | POA: Diagnosis not present

## 2020-11-08 DIAGNOSIS — R799 Abnormal finding of blood chemistry, unspecified: Secondary | ICD-10-CM | POA: Diagnosis not present

## 2020-11-08 NOTE — Telephone Encounter (Signed)
[employer] medicare order sent to [facility]. They will obtain the auth and reach out to the patient to schedule.  °

## 2020-11-08 NOTE — Progress Notes (Signed)
Chief Complaint  Patient presents with  . New Patient (Initial Visit)    Here with wife, Cesar Wilson. Referral for frequent headaches. Pain is moderate to severe and wakes him from sleep at times. Typically, the pain in in the back of his head, bilateral temporal region and behind his eyes.  He does have some light sensitivity as well. Ibuprofen helps some but does not always relieve it completely. He is feeling better after starting back on allergy medication yesterday. Got a new prescription for glasses at eye doctor yesterday.    HISTORICAL  Cesar Wilson is a 70 year old male, seen in request by optometrist Dr. Marin Comment, [facility] [place], for evaluation of frequent headaches, his primary care is [facility], [place], he is accompanied by his wife Cesar Wilson at today's visit November 08, 2020  I reviewed and summarized the referring note.PMHX. HTN GERD  He had a history of rare migraine headache in the past, reported 1 severe headache in 2017, with light noise sensitivity, nauseous, responded very well to Maxalt  Since then, he did not have any headaches, remain very active, he drives bends to pick up patient, sometimes at work and to 12 hours, he is only able to get to create lunch in between  On March 2, he began to develop holoacranial pressure headache, gradually building up, mixed with intermittent sharp radiating pain from bilateral temporal region to behind the eye, which only last for few seconds, his headache gradually increase over the past few days, open breaking up about 5 hours into sleep, he describes severe pounding headaches, is dry heave, nausea, has to take ibuprofen 2 to 3 tablets, only after that he can go to sleep  Over the past few days, his headache has been managed by frequent over-the-counter Advil, then begin to have more severe headache on November 04, 2020, 3 days into his headache, was treated at primary care's office, received a Toradol, and steroid injection, did not help his headache  much  On Saturday November 05, 2020, after strenuous work at yard, not eating, drinking for more than 5 hours, he felt lightheaded, dizziness, worsening headaches, relieved by sitting down for a while, he denies visual loss, denies lateralized motor or sensory deficit,  He denies fever, jaw claudication, body achy pain  REVIEW OF SYSTEMS: Full 14 system review of systems performed and notable only for as above All other review of systems were negative.  ALLERGIES: Allergies  Allergen Reactions  . Sulfa Antibiotics Hives  . Zithromax [Azithromycin]     Elevated BP  . Percocet [Oxycodone-Acetaminophen] Other (See Comments)    Keeps him awake, and does not relieve pain well    HOME MEDICATIONS: Current Outpatient Medications  Medication Sig Dispense Refill  . Ascorbic Acid (VITAMIN C) 1000 MG tablet Take 1,000 mg by mouth daily.    . cholecalciferol (VITAMIN D) 1000 UNITS tablet Take 1,000 Units by mouth daily. Vit. D3    . clonazePAM (KLONOPIN) 1 MG tablet Take 2 mg by mouth at bedtime.    Marland Kitchen diltiazem (TIAZAC) 240 MG 24 hr capsule Take 240 mg by mouth daily.    Marland Kitchen ibuprofen (ADVIL,MOTRIN) 400 MG tablet Take 400 mg by mouth every 6 (six) hours as needed.    . metoprolol succinate (TOPROL-XL) 50 MG 24 hr tablet Take 50 mg by mouth daily. Take with or immediately following a meal.    . Multiple Vitamins-Minerals (MULTIVITAMINS THER. W/MINERALS) TABS Take 1 tablet by mouth daily.    . naproxen (  NAPROSYN) 500 MG tablet Take 500 mg by mouth 2 (two) times daily with a meal.    . omeprazole (PRILOSEC) 20 MG capsule Take 20 mg by mouth daily.    . rizatriptan (MAXALT) 10 MG tablet Take 10 mg by mouth as needed for migraine. May repeat in 2 hours if needed     No current facility-administered medications for this visit.    PAST MEDICAL HISTORY: Past Medical History:  Diagnosis Date  . Anxiety   . Biceps tendon rupture    left distal  . GERD (gastroesophageal reflux disease)   . Hammer toe  of left foot    2nd and 3rd toes  . Hypertension    under control; has been on med. x 23 yrs.    PAST SURGICAL HISTORY: Past Surgical History:  Procedure Laterality Date  . arm surgery Left   . DISTAL BICEPS TENDON REPAIR  08/06/2011   Procedure: DISTAL BICEPS TENDON REPAIR;  Surgeon: Nita Sells, MD;  Location: [facility];  Service: Orthopedics;  Laterality: Left;  . HAMMER TOE SURGERY Right 11/18/2018   Procedure: RIGHT FOOT 2ND AND 3RD HAMMER TOE CORRECTION;  Surgeon: Erle Crocker, MD;  Location: [facility];  Service: Orthopedics;  Laterality: Right;  . HAMMERTOE RECONSTRUCTION WITH WEIL OSTEOTOMY Left 09/23/2018   Procedure: LEFT SECOND AND THIRD HAMMER TOE CORRECTION  WITH WEIL OSTEOTOMY LEFT SECOND;  Surgeon: Erle Crocker, MD;  Location: [facility];  Service: Orthopedics;  Laterality: Left;  . ORIF ANKLE FRACTURE  12/08/2009   left lat. malleolus  . TONSILLECTOMY AND ADENOIDECTOMY  as a child    FAMILY HISTORY: Family History  Problem Relation Age of Onset  . Parkinson's disease Mother   . Dementia Mother   . Leukemia Father     SOCIAL HISTORY: Social History   Socioeconomic History  . Marital status: Married    Spouse name: Cesar Wilson  . Number of children: 2  . Years of education: College  . Highest education level: Not on file  Occupational History  . Occupation: [employer]  Tobacco Use  . Smoking status: Former Research scientist (life sciences)  . Smokeless tobacco: Never Used  . Tobacco comment: quit smoking 20 yrs. ago  Substance and Sexual Activity  . Alcohol use: No  . Drug use: No  . Sexual activity: Not on file  Other Topics Concern  . Not on file  Social History Narrative   Right handed   Coffee daily   Lives with wife   Social Determinants of Health   Financial Resource Strain: Not on file  Food Insecurity: Not on file  Transportation Needs: Not on file  Physical Activity: Not on file   Stress: Not on file  Social Connections: Not on file  Intimate Partner Violence: Not on file     PHYSICAL EXAM   Vitals:   11/08/20 0814  BP: (!) 195/98  Pulse: (!) 56  Weight: 205 lb (93 kg)  Height: 5' 10"  (1.778 m)   Not recorded     Body mass index is 29.41 kg/m.  PHYSICAL EXAMNIATION:  Gen: NAD, conversant, well nourised, well groomed                     Cardiovascular: Regular rate rhythm, no peripheral edema, warm, nontender. Eyes: Conjunctivae clear without exudates or hemorrhage Neck: Supple, no carotid bruits. Pulmonary: Clear to auscultation bilaterally   NEUROLOGICAL EXAM:  MENTAL STATUS: Speech:  Speech is normal; fluent and spontaneous with normal comprehension.  Cognition:     Orientation to time, place and person     Normal recent and remote memory     Normal Attention span and concentration     Normal Language, naming, repeating,spontaneous speech     Fund of knowledge   CRANIAL NERVES: CN II: Visual fields are full to confrontation. Pupils are round equal and briskly reactive to light. CN III, IV, VI: extraocular movement are normal. No ptosis. CN V: Facial sensation is intact to light touch CN VII: Face is symmetric with normal eye closure  CN VIII: Hearing is normal to causal conversation. CN IX, X: Phonation is normal. CN XI: Head turning and shoulder shrug are intact  MOTOR: There is no pronator drift of out-stretched arms. Muscle bulk and tone are normal. Muscle strength is normal.  REFLEXES: Reflexes are 2+ and symmetric at the biceps, triceps, knees, and ankles. Plantar responses are flexor.  SENSORY: Intact to light touch, pinprick and vibratory sensation are intact in fingers and toes.  COORDINATION: There is no trunk or limb dysmetria noted.  GAIT/STANCE: Posture is normal. Gait is steady with normal steps, base, arm swing, and turning. Heel and toe walking are normal.  Mild difficulty with tandem walking Romberg is  absent.   DIAGNOSTIC DATA (LABS, IMAGING, TESTING) - I reviewed patient records, labs, notes, testing and imaging myself where available.   ASSESSMENT AND PLAN  Cesar Wilson is a 70 y.o. male   New onset persistent headaches History of migraine headaches  Headache in 70s, will rule out temporal arteritis, ESR C-reactive protein,  He reported worsening headache after lying down flat for few hours, MRI of the brain to rule out structural abnormalities  As needed NSAIDs now   Marcial Pacas, M.D. Ph.D.  [facility] [address] [place], [place] [address] Ph: [phone] Fax: [phone]  CC:  Marin Comment [place], [place],  [place] [address]  Lennie Odor, [place]

## 2020-11-09 ENCOUNTER — Telehealth: Payer: Self-pay | Admitting: Neurology

## 2020-11-09 LAB — C-REACTIVE PROTEIN: CRP: <1 mg/L (ref 0–10)

## 2020-11-09 LAB — TSH: TSH: 4.69 u[IU]/mL — ABNORMAL HIGH (ref 0.450–4.500)

## 2020-11-09 LAB — SEDIMENTATION RATE: Sed Rate: 2 mm/hr (ref 0–30)

## 2020-11-09 NOTE — Telephone Encounter (Signed)
I spoke to the patient and provided him with the lab results. He will contact his PCP for follow up on the elevated TSH.

## 2020-11-09 NOTE — Telephone Encounter (Signed)
Please call patient, laboratory evaluation showed no evidence of systemic inflammation, elevated TSH could indicate hypothyroidism, he should continue follow-up with his primary care physician, I have forward the lab result to his primary care Redmon, [place], [place]

## 2020-11-10 ENCOUNTER — Ambulatory Visit
Admission: RE | Admit: 2020-11-10 | Discharge: 2020-11-10 | Disposition: A | Discharge status: Home / Self Care | Payer: Medicare HMO | Source: Ambulatory Visit | Attending: Neurology | Admitting: Neurology

## 2020-11-10 ENCOUNTER — Other Ambulatory Visit: Payer: Self-pay

## 2020-11-10 ENCOUNTER — Telehealth: Payer: Self-pay | Admitting: Neurology

## 2020-11-10 DIAGNOSIS — R519 Headache, unspecified: Secondary | ICD-10-CM

## 2020-11-10 NOTE — Telephone Encounter (Addendum)
I spoke to the patient and he verbalized understanding of the MRI brain result below.

## 2020-11-10 NOTE — Telephone Encounter (Signed)
°  IMPRESSION:   MRI brain (without) demonstrating: - Mild periventricular and subcortical foci of non-specific gliosis.  - No acute findings.  Please call patient, MRI of the brain showed mild age-related changes, mild generalized atrophy supratentorium small vessel disease, there was no acute abnormalities.

## 2020-11-17 DIAGNOSIS — E039 Hypothyroidism, unspecified: Secondary | ICD-10-CM | POA: Diagnosis not present

## 2020-12-07 DIAGNOSIS — R0981 Nasal congestion: Secondary | ICD-10-CM | POA: Diagnosis not present

## 2020-12-07 DIAGNOSIS — R0982 Postnasal drip: Secondary | ICD-10-CM | POA: Diagnosis not present

## 2021-01-19 DIAGNOSIS — E039 Hypothyroidism, unspecified: Secondary | ICD-10-CM | POA: Diagnosis not present

## 2021-02-02 DIAGNOSIS — R972 Elevated prostate specific antigen [PSA]: Secondary | ICD-10-CM | POA: Diagnosis not present

## 2021-02-09 DIAGNOSIS — N400 Enlarged prostate without lower urinary tract symptoms: Secondary | ICD-10-CM | POA: Diagnosis not present

## 2021-02-09 DIAGNOSIS — R972 Elevated prostate specific antigen [PSA]: Secondary | ICD-10-CM | POA: Diagnosis not present

## 2021-05-15 DIAGNOSIS — S39012A Strain of muscle, fascia and tendon of lower back, initial encounter: Secondary | ICD-10-CM | POA: Diagnosis not present

## 2021-06-17 DIAGNOSIS — J340 Abscess, furuncle and carbuncle of nose: Secondary | ICD-10-CM | POA: Diagnosis not present

## 2021-06-19 DIAGNOSIS — I10 Essential (primary) hypertension: Secondary | ICD-10-CM | POA: Diagnosis not present

## 2021-06-19 DIAGNOSIS — L02412 Cutaneous abscess of left axilla: Secondary | ICD-10-CM | POA: Diagnosis not present

## 2021-08-17 DIAGNOSIS — Z1211 Encounter for screening for malignant neoplasm of colon: Secondary | ICD-10-CM | POA: Diagnosis not present

## 2021-08-17 DIAGNOSIS — E7800 Pure hypercholesterolemia, unspecified: Secondary | ICD-10-CM | POA: Diagnosis not present

## 2021-08-17 DIAGNOSIS — I10 Essential (primary) hypertension: Secondary | ICD-10-CM | POA: Diagnosis not present

## 2021-08-17 DIAGNOSIS — K219 Gastro-esophageal reflux disease without esophagitis: Secondary | ICD-10-CM | POA: Diagnosis not present

## 2021-08-17 DIAGNOSIS — Z125 Encounter for screening for malignant neoplasm of prostate: Secondary | ICD-10-CM | POA: Diagnosis not present

## 2021-08-17 DIAGNOSIS — Z0000 Encounter for general adult medical examination without abnormal findings: Secondary | ICD-10-CM | POA: Diagnosis not present

## 2021-08-17 DIAGNOSIS — R739 Hyperglycemia, unspecified: Secondary | ICD-10-CM | POA: Diagnosis not present

## 2021-08-17 DIAGNOSIS — Z23 Encounter for immunization: Secondary | ICD-10-CM | POA: Diagnosis not present

## 2021-08-17 DIAGNOSIS — E039 Hypothyroidism, unspecified: Secondary | ICD-10-CM | POA: Diagnosis not present

## 2021-08-17 DIAGNOSIS — J309 Allergic rhinitis, unspecified: Secondary | ICD-10-CM | POA: Diagnosis not present

## 2021-10-09 DIAGNOSIS — M47812 Spondylosis without myelopathy or radiculopathy, cervical region: Secondary | ICD-10-CM | POA: Diagnosis not present

## 2021-10-09 DIAGNOSIS — M47816 Spondylosis without myelopathy or radiculopathy, lumbar region: Secondary | ICD-10-CM | POA: Diagnosis not present

## 2021-10-12 DIAGNOSIS — M50323 Other cervical disc degeneration at C6-C7 level: Secondary | ICD-10-CM | POA: Diagnosis not present

## 2021-10-12 DIAGNOSIS — M25552 Pain in left hip: Secondary | ICD-10-CM | POA: Diagnosis not present

## 2021-10-12 DIAGNOSIS — M9901 Segmental and somatic dysfunction of cervical region: Secondary | ICD-10-CM | POA: Diagnosis not present

## 2021-10-12 DIAGNOSIS — M9905 Segmental and somatic dysfunction of pelvic region: Secondary | ICD-10-CM | POA: Diagnosis not present

## 2021-10-14 DIAGNOSIS — Z0100 Encounter for examination of eyes and vision without abnormal findings: Secondary | ICD-10-CM | POA: Diagnosis not present

## 2021-10-17 DIAGNOSIS — M9901 Segmental and somatic dysfunction of cervical region: Secondary | ICD-10-CM | POA: Diagnosis not present

## 2021-10-17 DIAGNOSIS — M25552 Pain in left hip: Secondary | ICD-10-CM | POA: Diagnosis not present

## 2021-10-17 DIAGNOSIS — M50323 Other cervical disc degeneration at C6-C7 level: Secondary | ICD-10-CM | POA: Diagnosis not present

## 2021-10-17 DIAGNOSIS — M9905 Segmental and somatic dysfunction of pelvic region: Secondary | ICD-10-CM | POA: Diagnosis not present

## 2021-10-24 DIAGNOSIS — R0981 Nasal congestion: Secondary | ICD-10-CM | POA: Diagnosis not present

## 2021-10-24 DIAGNOSIS — B349 Viral infection, unspecified: Secondary | ICD-10-CM | POA: Diagnosis not present

## 2021-10-24 DIAGNOSIS — R519 Headache, unspecified: Secondary | ICD-10-CM | POA: Diagnosis not present

## 2021-10-24 DIAGNOSIS — R051 Acute cough: Secondary | ICD-10-CM | POA: Diagnosis not present

## 2021-10-24 DIAGNOSIS — Z20822 Contact with and (suspected) exposure to covid-19: Secondary | ICD-10-CM | POA: Diagnosis not present

## 2021-11-02 DIAGNOSIS — M9905 Segmental and somatic dysfunction of pelvic region: Secondary | ICD-10-CM | POA: Diagnosis not present

## 2021-11-02 DIAGNOSIS — M50323 Other cervical disc degeneration at C6-C7 level: Secondary | ICD-10-CM | POA: Diagnosis not present

## 2021-11-02 DIAGNOSIS — M9901 Segmental and somatic dysfunction of cervical region: Secondary | ICD-10-CM | POA: Diagnosis not present

## 2021-11-02 DIAGNOSIS — M25552 Pain in left hip: Secondary | ICD-10-CM | POA: Diagnosis not present

## 2021-11-07 DIAGNOSIS — M50323 Other cervical disc degeneration at C6-C7 level: Secondary | ICD-10-CM | POA: Diagnosis not present

## 2021-11-07 DIAGNOSIS — M9905 Segmental and somatic dysfunction of pelvic region: Secondary | ICD-10-CM | POA: Diagnosis not present

## 2021-11-07 DIAGNOSIS — M25552 Pain in left hip: Secondary | ICD-10-CM | POA: Diagnosis not present

## 2021-11-07 DIAGNOSIS — M9901 Segmental and somatic dysfunction of cervical region: Secondary | ICD-10-CM | POA: Diagnosis not present

## 2021-11-09 DIAGNOSIS — M9901 Segmental and somatic dysfunction of cervical region: Secondary | ICD-10-CM | POA: Diagnosis not present

## 2021-11-09 DIAGNOSIS — M25552 Pain in left hip: Secondary | ICD-10-CM | POA: Diagnosis not present

## 2021-11-09 DIAGNOSIS — M9905 Segmental and somatic dysfunction of pelvic region: Secondary | ICD-10-CM | POA: Diagnosis not present

## 2021-11-09 DIAGNOSIS — M50323 Other cervical disc degeneration at C6-C7 level: Secondary | ICD-10-CM | POA: Diagnosis not present

## 2021-11-14 DIAGNOSIS — M50323 Other cervical disc degeneration at C6-C7 level: Secondary | ICD-10-CM | POA: Diagnosis not present

## 2021-11-14 DIAGNOSIS — M9901 Segmental and somatic dysfunction of cervical region: Secondary | ICD-10-CM | POA: Diagnosis not present

## 2021-11-14 DIAGNOSIS — M9905 Segmental and somatic dysfunction of pelvic region: Secondary | ICD-10-CM | POA: Diagnosis not present

## 2021-11-14 DIAGNOSIS — M25552 Pain in left hip: Secondary | ICD-10-CM | POA: Diagnosis not present

## 2021-11-16 DIAGNOSIS — M9905 Segmental and somatic dysfunction of pelvic region: Secondary | ICD-10-CM | POA: Diagnosis not present

## 2021-11-16 DIAGNOSIS — M50323 Other cervical disc degeneration at C6-C7 level: Secondary | ICD-10-CM | POA: Diagnosis not present

## 2021-11-16 DIAGNOSIS — M9901 Segmental and somatic dysfunction of cervical region: Secondary | ICD-10-CM | POA: Diagnosis not present

## 2021-11-16 DIAGNOSIS — M25552 Pain in left hip: Secondary | ICD-10-CM | POA: Diagnosis not present

## 2021-11-21 DIAGNOSIS — M9901 Segmental and somatic dysfunction of cervical region: Secondary | ICD-10-CM | POA: Diagnosis not present

## 2021-11-21 DIAGNOSIS — M50323 Other cervical disc degeneration at C6-C7 level: Secondary | ICD-10-CM | POA: Diagnosis not present

## 2021-11-21 DIAGNOSIS — M9905 Segmental and somatic dysfunction of pelvic region: Secondary | ICD-10-CM | POA: Diagnosis not present

## 2021-11-21 DIAGNOSIS — M25552 Pain in left hip: Secondary | ICD-10-CM | POA: Diagnosis not present

## 2021-11-23 DIAGNOSIS — M50323 Other cervical disc degeneration at C6-C7 level: Secondary | ICD-10-CM | POA: Diagnosis not present

## 2021-11-23 DIAGNOSIS — M9905 Segmental and somatic dysfunction of pelvic region: Secondary | ICD-10-CM | POA: Diagnosis not present

## 2021-11-23 DIAGNOSIS — M25552 Pain in left hip: Secondary | ICD-10-CM | POA: Diagnosis not present

## 2021-11-23 DIAGNOSIS — M9901 Segmental and somatic dysfunction of cervical region: Secondary | ICD-10-CM | POA: Diagnosis not present

## 2021-11-25 DIAGNOSIS — J019 Acute sinusitis, unspecified: Secondary | ICD-10-CM | POA: Diagnosis not present

## 2021-11-25 DIAGNOSIS — R0981 Nasal congestion: Secondary | ICD-10-CM | POA: Diagnosis not present

## 2021-11-25 DIAGNOSIS — J40 Bronchitis, not specified as acute or chronic: Secondary | ICD-10-CM | POA: Diagnosis not present

## 2021-11-25 DIAGNOSIS — Z20822 Contact with and (suspected) exposure to covid-19: Secondary | ICD-10-CM | POA: Diagnosis not present

## 2021-11-28 DIAGNOSIS — M9901 Segmental and somatic dysfunction of cervical region: Secondary | ICD-10-CM | POA: Diagnosis not present

## 2021-11-28 DIAGNOSIS — M9905 Segmental and somatic dysfunction of pelvic region: Secondary | ICD-10-CM | POA: Diagnosis not present

## 2021-11-28 DIAGNOSIS — M50323 Other cervical disc degeneration at C6-C7 level: Secondary | ICD-10-CM | POA: Diagnosis not present

## 2021-11-28 DIAGNOSIS — M25552 Pain in left hip: Secondary | ICD-10-CM | POA: Diagnosis not present

## 2021-12-05 DIAGNOSIS — M50323 Other cervical disc degeneration at C6-C7 level: Secondary | ICD-10-CM | POA: Diagnosis not present

## 2021-12-05 DIAGNOSIS — M9901 Segmental and somatic dysfunction of cervical region: Secondary | ICD-10-CM | POA: Diagnosis not present

## 2021-12-05 DIAGNOSIS — M25552 Pain in left hip: Secondary | ICD-10-CM | POA: Diagnosis not present

## 2021-12-05 DIAGNOSIS — M9905 Segmental and somatic dysfunction of pelvic region: Secondary | ICD-10-CM | POA: Diagnosis not present

## 2021-12-11 DIAGNOSIS — M50323 Other cervical disc degeneration at C6-C7 level: Secondary | ICD-10-CM | POA: Diagnosis not present

## 2021-12-11 DIAGNOSIS — M25552 Pain in left hip: Secondary | ICD-10-CM | POA: Diagnosis not present

## 2021-12-11 DIAGNOSIS — M9905 Segmental and somatic dysfunction of pelvic region: Secondary | ICD-10-CM | POA: Diagnosis not present

## 2021-12-11 DIAGNOSIS — M9901 Segmental and somatic dysfunction of cervical region: Secondary | ICD-10-CM | POA: Diagnosis not present

## 2021-12-12 DIAGNOSIS — M50323 Other cervical disc degeneration at C6-C7 level: Secondary | ICD-10-CM | POA: Diagnosis not present

## 2021-12-12 DIAGNOSIS — M25552 Pain in left hip: Secondary | ICD-10-CM | POA: Diagnosis not present

## 2021-12-12 DIAGNOSIS — M9901 Segmental and somatic dysfunction of cervical region: Secondary | ICD-10-CM | POA: Diagnosis not present

## 2021-12-12 DIAGNOSIS — M9905 Segmental and somatic dysfunction of pelvic region: Secondary | ICD-10-CM | POA: Diagnosis not present

## 2021-12-14 DIAGNOSIS — M9905 Segmental and somatic dysfunction of pelvic region: Secondary | ICD-10-CM | POA: Diagnosis not present

## 2021-12-14 DIAGNOSIS — M9901 Segmental and somatic dysfunction of cervical region: Secondary | ICD-10-CM | POA: Diagnosis not present

## 2021-12-14 DIAGNOSIS — M25552 Pain in left hip: Secondary | ICD-10-CM | POA: Diagnosis not present

## 2021-12-14 DIAGNOSIS — M50323 Other cervical disc degeneration at C6-C7 level: Secondary | ICD-10-CM | POA: Diagnosis not present

## 2021-12-14 DIAGNOSIS — E039 Hypothyroidism, unspecified: Secondary | ICD-10-CM | POA: Diagnosis not present

## 2021-12-21 DIAGNOSIS — M9901 Segmental and somatic dysfunction of cervical region: Secondary | ICD-10-CM | POA: Diagnosis not present

## 2021-12-21 DIAGNOSIS — M25552 Pain in left hip: Secondary | ICD-10-CM | POA: Diagnosis not present

## 2021-12-21 DIAGNOSIS — M50323 Other cervical disc degeneration at C6-C7 level: Secondary | ICD-10-CM | POA: Diagnosis not present

## 2021-12-21 DIAGNOSIS — M9905 Segmental and somatic dysfunction of pelvic region: Secondary | ICD-10-CM | POA: Diagnosis not present

## 2022-01-04 DIAGNOSIS — M50323 Other cervical disc degeneration at C6-C7 level: Secondary | ICD-10-CM | POA: Diagnosis not present

## 2022-01-04 DIAGNOSIS — M9901 Segmental and somatic dysfunction of cervical region: Secondary | ICD-10-CM | POA: Diagnosis not present

## 2022-01-04 DIAGNOSIS — M25552 Pain in left hip: Secondary | ICD-10-CM | POA: Diagnosis not present

## 2022-01-04 DIAGNOSIS — M9905 Segmental and somatic dysfunction of pelvic region: Secondary | ICD-10-CM | POA: Diagnosis not present

## 2022-02-15 DIAGNOSIS — R972 Elevated prostate specific antigen [PSA]: Secondary | ICD-10-CM | POA: Diagnosis not present

## 2022-02-22 DIAGNOSIS — N5201 Erectile dysfunction due to arterial insufficiency: Secondary | ICD-10-CM | POA: Diagnosis not present

## 2022-02-22 DIAGNOSIS — R972 Elevated prostate specific antigen [PSA]: Secondary | ICD-10-CM | POA: Diagnosis not present

## 2022-02-22 DIAGNOSIS — N400 Enlarged prostate without lower urinary tract symptoms: Secondary | ICD-10-CM | POA: Diagnosis not present

## 2022-04-29 ENCOUNTER — Emergency Department (HOSPITAL_COMMUNITY): Payer: Medicare HMO

## 2022-04-29 ENCOUNTER — Observation Stay (HOSPITAL_COMMUNITY): Payer: Medicare HMO

## 2022-04-29 ENCOUNTER — Encounter (HOSPITAL_COMMUNITY)
Admission: EM | Disposition: A | Discharge status: Home / Self Care | Payer: Self-pay | Source: Home / Self Care | Attending: Cardiology

## 2022-04-29 ENCOUNTER — Encounter (HOSPITAL_COMMUNITY): Payer: Self-pay

## 2022-04-29 ENCOUNTER — Other Ambulatory Visit: Payer: Self-pay

## 2022-04-29 ENCOUNTER — Inpatient Hospital Stay (HOSPITAL_COMMUNITY)
Admission: EM | Admit: 2022-04-29 | Discharge: 2022-05-02 | DRG: 243 | Disposition: A | Discharge status: Home / Self Care | Payer: Medicare HMO | Attending: Cardiology | Admitting: Cardiology

## 2022-04-29 ENCOUNTER — Inpatient Hospital Stay (HOSPITAL_COMMUNITY): Payer: Medicare HMO

## 2022-04-29 DIAGNOSIS — I442 Atrioventricular block, complete: Principal | ICD-10-CM | POA: Diagnosis present

## 2022-04-29 DIAGNOSIS — Z882 Allergy status to sulfonamides status: Secondary | ICD-10-CM

## 2022-04-29 DIAGNOSIS — N179 Acute kidney failure, unspecified: Secondary | ICD-10-CM | POA: Diagnosis not present

## 2022-04-29 DIAGNOSIS — E039 Hypothyroidism, unspecified: Secondary | ICD-10-CM | POA: Diagnosis not present

## 2022-04-29 DIAGNOSIS — R0602 Shortness of breath: Secondary | ICD-10-CM | POA: Diagnosis not present

## 2022-04-29 DIAGNOSIS — J9811 Atelectasis: Secondary | ICD-10-CM | POA: Diagnosis not present

## 2022-04-29 DIAGNOSIS — Z885 Allergy status to narcotic agent status: Secondary | ICD-10-CM | POA: Diagnosis not present

## 2022-04-29 DIAGNOSIS — Z87891 Personal history of nicotine dependence: Secondary | ICD-10-CM

## 2022-04-29 DIAGNOSIS — Z880 Allergy status to penicillin: Secondary | ICD-10-CM | POA: Diagnosis not present

## 2022-04-29 DIAGNOSIS — Z806 Family history of leukemia: Secondary | ICD-10-CM

## 2022-04-29 DIAGNOSIS — I441 Atrioventricular block, second degree: Secondary | ICD-10-CM | POA: Diagnosis not present

## 2022-04-29 DIAGNOSIS — R001 Bradycardia, unspecified: Secondary | ICD-10-CM | POA: Diagnosis present

## 2022-04-29 DIAGNOSIS — R0609 Other forms of dyspnea: Secondary | ICD-10-CM

## 2022-04-29 DIAGNOSIS — Z950 Presence of cardiac pacemaker: Secondary | ICD-10-CM | POA: Diagnosis not present

## 2022-04-29 DIAGNOSIS — Z79899 Other long term (current) drug therapy: Secondary | ICD-10-CM | POA: Diagnosis not present

## 2022-04-29 DIAGNOSIS — Z79890 Hormone replacement therapy (postmenopausal): Secondary | ICD-10-CM | POA: Diagnosis not present

## 2022-04-29 DIAGNOSIS — Z820 Family history of epilepsy and other diseases of the nervous system: Secondary | ICD-10-CM

## 2022-04-29 DIAGNOSIS — I10 Essential (primary) hypertension: Secondary | ICD-10-CM | POA: Diagnosis not present

## 2022-04-29 DIAGNOSIS — K219 Gastro-esophageal reflux disease without esophagitis: Secondary | ICD-10-CM | POA: Diagnosis not present

## 2022-04-29 DIAGNOSIS — I459 Conduction disorder, unspecified: Principal | ICD-10-CM

## 2022-04-29 HISTORY — PX: TEMPORARY PACEMAKER: CATH118268

## 2022-04-29 LAB — URINALYSIS, ROUTINE W REFLEX MICROSCOPIC
Bilirubin Urine: NEGATIVE
Glucose, UA: NEGATIVE mg/dL
Hgb urine dipstick: NEGATIVE
Ketones, ur: NEGATIVE mg/dL
Leukocytes,Ua: NEGATIVE
Nitrite: NEGATIVE
Protein, ur: NEGATIVE mg/dL
Specific Gravity, Urine: 1.008 (ref 1.005–1.030)
pH: 5 (ref 5.0–8.0)

## 2022-04-29 LAB — CBC WITH DIFFERENTIAL/PLATELET
Abs Immature Granulocytes: 0.01 10*3/uL (ref 0.00–0.07)
Basophils Absolute: 0 10*3/uL (ref 0.0–0.1)
Basophils Relative: 1 %
Eosinophils Absolute: 0.2 10*3/uL (ref 0.0–0.5)
Eosinophils Relative: 3 %
HCT: 43 % (ref 39.0–52.0)
Hemoglobin: 14.4 g/dL (ref 13.0–17.0)
Immature Granulocytes: 0 %
Lymphocytes Relative: 18 %
Lymphs Abs: 1.2 10*3/uL (ref 0.7–4.0)
MCH: 30.1 pg (ref 26.0–34.0)
MCHC: 33.5 g/dL (ref 30.0–36.0)
MCV: 89.8 fL (ref 80.0–100.0)
Monocytes Absolute: 0.6 10*3/uL (ref 0.1–1.0)
Monocytes Relative: 9 %
Neutro Abs: 5 10*3/uL (ref 1.7–7.7)
Neutrophils Relative %: 69 %
Platelets: 178 10*3/uL (ref 150–400)
RBC: 4.79 MIL/uL (ref 4.22–5.81)
RDW: 13.1 % (ref 11.5–15.5)
WBC: 7.1 10*3/uL (ref 4.0–10.5)
nRBC: 0 % (ref 0.0–0.2)

## 2022-04-29 LAB — COMPREHENSIVE METABOLIC PANEL
ALT: 21 U/L (ref 0–44)
AST: 20 U/L (ref 15–41)
Albumin: 4 g/dL (ref 3.5–5.0)
Alkaline Phosphatase: 74 U/L (ref 38–126)
Anion gap: 8 (ref 5–15)
BUN: 28 mg/dL — ABNORMAL HIGH (ref 8–23)
CO2: 24 mmol/L (ref 22–32)
Calcium: 9.3 mg/dL (ref 8.9–10.3)
Chloride: 106 mmol/L (ref 98–111)
Creatinine, Ser: 1.59 mg/dL — ABNORMAL HIGH (ref 0.61–1.24)
GFR, Estimated: 46 mL/min — ABNORMAL LOW (ref >60)
Glucose, Bld: 111 mg/dL — ABNORMAL HIGH (ref 70–99)
Potassium: 4.1 mmol/L (ref 3.5–5.1)
Sodium: 138 mmol/L (ref 135–145)
Total Bilirubin: 0.5 mg/dL (ref 0.3–1.2)
Total Protein: 6.6 g/dL (ref 6.5–8.1)

## 2022-04-29 LAB — BRAIN NATRIURETIC PEPTIDE: B Natriuretic Peptide: 127.2 pg/mL — ABNORMAL HIGH (ref 0.0–100.0)

## 2022-04-29 LAB — MRSA NEXT GEN BY PCR, NASAL: MRSA by PCR Next Gen: NOT DETECTED

## 2022-04-29 LAB — TROPONIN I (HIGH SENSITIVITY)
Troponin I (High Sensitivity): 9 ng/L (ref <18)
Troponin I (High Sensitivity): 8 ng/L (ref <18)

## 2022-04-29 LAB — CBG MONITORING, ED: Glucose-Capillary: 101 mg/dL — ABNORMAL HIGH (ref 70–99)

## 2022-04-29 LAB — CREATININE, URINE, RANDOM: Creatinine, Urine: 51 mg/dL

## 2022-04-29 LAB — GLUCOSE, CAPILLARY: Glucose-Capillary: 98 mg/dL (ref 70–99)

## 2022-04-29 LAB — MAGNESIUM: Magnesium: 1.9 mg/dL (ref 1.7–2.4)

## 2022-04-29 LAB — TSH: TSH: 2.674 u[IU]/mL (ref 0.350–4.500)

## 2022-04-29 LAB — PHOSPHORUS: Phosphorus: 3.3 mg/dL (ref 2.5–4.6)

## 2022-04-29 LAB — SODIUM, URINE, RANDOM: Sodium, Ur: 59 mmol/L

## 2022-04-29 SURGERY — TEMPORARY PACEMAKER
Anesthesia: LOCAL

## 2022-04-29 MED ORDER — HEPARIN SODIUM (PORCINE) 5000 UNIT/ML IJ SOLN: 5000.0000 [IU] | Freq: Two times a day (BID) | INTRAMUSCULAR | Status: DC

## 2022-04-29 MED ORDER — SODIUM CHLORIDE 0.9 % IV SOLN
250.0000 mL | INTRAVENOUS | Status: DC | PRN
  Administered 2022-04-29: 250 mL via INTRAVENOUS

## 2022-04-29 MED ORDER — ALBUTEROL SULFATE (2.5 MG/3ML) 0.083% IN NEBU: 2.5000 mg | INHALATION_SOLUTION | Freq: Once | RESPIRATORY_TRACT | Status: DC

## 2022-04-29 MED ORDER — HEPARIN (PORCINE) IN NACL 1000-0.9 UT/500ML-% IV SOLN
INTRAVENOUS | Status: DC | PRN
  Administered 2022-04-29: 500 mL

## 2022-04-29 MED ORDER — ALBUTEROL SULFATE HFA 108 (90 BASE) MCG/ACT IN AERS: 2.0000 | INHALATION_SPRAY | RESPIRATORY_TRACT | Status: DC | PRN

## 2022-04-29 MED ORDER — HEPARIN SODIUM (PORCINE) 5000 UNIT/ML IJ SOLN
5000.0000 [IU] | Freq: Three times a day (TID) | INTRAMUSCULAR | Status: DC
  Administered 2022-04-29 – 2022-05-01 (×5): 5000 [IU] via SUBCUTANEOUS
  Filled 2022-04-29 (×3): qty 1

## 2022-04-29 MED ORDER — SUMATRIPTAN SUCCINATE 50 MG PO TABS: 50.0000 mg | ORAL_TABLET | ORAL | Status: DC | PRN

## 2022-04-29 MED ORDER — SODIUM CHLORIDE 0.9% FLUSH: 3.0000 mL | INTRAVENOUS | Status: DC | PRN

## 2022-04-29 MED ORDER — SODIUM CHLORIDE 0.9 % IV SOLN
INTRAVENOUS | Status: AC | PRN
  Administered 2022-04-29: 10 mL/h via INTRAVENOUS

## 2022-04-29 MED ORDER — FENTANYL CITRATE (PF) 100 MCG/2ML IJ SOLN
INTRAMUSCULAR | Status: AC
  Filled 2022-04-29: qty 2

## 2022-04-29 MED ORDER — SODIUM CHLORIDE 0.9% FLUSH
3.0000 mL | Freq: Two times a day (BID) | INTRAVENOUS | Status: DC
  Administered 2022-04-29 – 2022-04-30 (×3): 3 mL via INTRAVENOUS

## 2022-04-29 MED ORDER — LIDOCAINE HCL (PF) 1 % IJ SOLN
INTRAMUSCULAR | Status: AC
  Filled 2022-04-29: qty 30

## 2022-04-29 MED ORDER — CLONAZEPAM 0.5 MG PO TABS: 2.0000 mg | ORAL_TABLET | Freq: Every day | ORAL | Status: DC

## 2022-04-29 MED ORDER — PANTOPRAZOLE SODIUM 40 MG PO TBEC
40.0000 mg | DELAYED_RELEASE_TABLET | Freq: Every day | ORAL | Status: DC
  Administered 2022-04-30 – 2022-05-02 (×3): 40 mg via ORAL
  Filled 2022-04-29 (×3): qty 1

## 2022-04-29 MED ORDER — AMLODIPINE BESYLATE 5 MG PO TABS
10.0000 mg | ORAL_TABLET | Freq: Every day | ORAL | Status: DC
Start: 2022-04-30 — End: 2022-04-29

## 2022-04-29 MED ORDER — ONDANSETRON HCL 4 MG/2ML IJ SOLN: 4.0000 mg | Freq: Four times a day (QID) | INTRAMUSCULAR | Status: DC | PRN

## 2022-04-29 MED ORDER — ALBUTEROL SULFATE (2.5 MG/3ML) 0.083% IN NEBU
2.5000 mg | INHALATION_SOLUTION | RESPIRATORY_TRACT | Status: DC | PRN
Start: 2022-04-29 — End: 2022-05-02

## 2022-04-29 MED ORDER — AMLODIPINE BESYLATE 10 MG PO TABS
10.0000 mg | ORAL_TABLET | Freq: Every day | ORAL | Status: DC
  Administered 2022-04-30 – 2022-05-02 (×3): 10 mg via ORAL
  Filled 2022-04-29 (×3): qty 1

## 2022-04-29 MED ORDER — LIDOCAINE HCL (PF) 1 % IJ SOLN
INTRAMUSCULAR | Status: DC | PRN
  Administered 2022-04-29: 5 mL

## 2022-04-29 MED ORDER — CHLORHEXIDINE GLUCONATE CLOTH 2 % EX PADS
6.0000 | MEDICATED_PAD | Freq: Every day | CUTANEOUS | Status: DC
  Administered 2022-04-29 – 2022-05-01 (×3): 6 via TOPICAL

## 2022-04-29 MED ORDER — AMLODIPINE BESYLATE 5 MG PO TABS: 5.0000 mg | ORAL_TABLET | Freq: Every day | ORAL | Status: DC

## 2022-04-29 MED ORDER — ACETAMINOPHEN 325 MG PO TABS
650.0000 mg | ORAL_TABLET | ORAL | Status: DC | PRN
  Administered 2022-04-29 – 2022-05-01 (×4): 650 mg via ORAL
  Filled 2022-04-29 (×4): qty 2

## 2022-04-29 MED ORDER — LABETALOL HCL 5 MG/ML IV SOLN: 10.0000 mg | INTRAVENOUS | Status: DC | PRN

## 2022-04-29 MED ORDER — HYDRALAZINE HCL 20 MG/ML IJ SOLN: 10.0000 mg | INTRAMUSCULAR | Status: DC | PRN

## 2022-04-29 MED ORDER — FENTANYL CITRATE (PF) 100 MCG/2ML IJ SOLN
INTRAMUSCULAR | Status: DC | PRN
  Administered 2022-04-29: 25 ug via INTRAVENOUS

## 2022-04-29 MED ORDER — SODIUM CHLORIDE 0.9 % IV BOLUS
1000.0000 mL | Freq: Once | INTRAVENOUS | Status: AC
  Administered 2022-04-29: 1000 mL via INTRAVENOUS

## 2022-04-29 MED ORDER — HYDRALAZINE HCL 25 MG PO TABS: 25.0000 mg | ORAL_TABLET | Freq: Four times a day (QID) | ORAL | Status: DC | PRN

## 2022-04-29 MED ORDER — IBUPROFEN 200 MG PO TABS: 400.0000 mg | ORAL_TABLET | Freq: Four times a day (QID) | ORAL | Status: DC | PRN

## 2022-04-29 MED ORDER — LEVOTHYROXINE SODIUM 50 MCG PO TABS
50.0000 ug | ORAL_TABLET | Freq: Every day | ORAL | Status: DC
  Administered 2022-04-30 – 2022-05-02 (×3): 50 ug via ORAL
  Filled 2022-04-29 (×3): qty 1

## 2022-04-29 MED ORDER — MIDAZOLAM HCL 2 MG/2ML IJ SOLN
INTRAMUSCULAR | Status: DC | PRN
  Administered 2022-04-29: 1 mg via INTRAVENOUS

## 2022-04-29 MED ORDER — HYDRALAZINE HCL 25 MG PO TABS
50.0000 mg | ORAL_TABLET | Freq: Once | ORAL | Status: AC
  Administered 2022-04-29: 50 mg via ORAL
  Filled 2022-04-29: qty 2

## 2022-04-29 MED ORDER — MIDAZOLAM HCL 2 MG/2ML IJ SOLN
INTRAMUSCULAR | Status: AC
  Filled 2022-04-29: qty 2

## 2022-04-29 SURGICAL SUPPLY — 10 items
CABLE ADAPT PACING TEMP 12FT (ADAPTER) IMPLANT
KIT MICROPUNCTURE NIT STIFF (SHEATH) IMPLANT
PACK CARDIAC CATHETERIZATION (CUSTOM PROCEDURE TRAY) ×1 IMPLANT
PROTECTION STATION PRESSURIZED (MISCELLANEOUS) ×1
SHEATH PINNACLE 6F 10CM (SHEATH) IMPLANT
SHEATH PROBE COVER 6X72 (BAG) IMPLANT
SLEEVE REPOSITIONING LENGTH 30 (MISCELLANEOUS) IMPLANT
STATION PROTECTION PRESSURIZED (MISCELLANEOUS) IMPLANT
WIRE EMERALD 3MM-J .025X260CM (WIRE) IMPLANT
WIRE PACING TEMP ST TIP 5 (CATHETERS) IMPLANT

## 2022-04-29 NOTE — ED Provider Triage Note (Signed)
Emergency Medicine Provider Triage Evaluation Note  Cesar Wilson , a 71 y.o. male  was evaluated in triage.  Pt complains of dyspnea on exertion and dizziness over the past 2 to 3 weeks.  States that it is been intermittent until the past 2 or 3 days when it has been persistent.  He states that every time he gets up to walk he becomes short of breath and feels lightheaded.  He states that his wife symptoms blood pressure was high but he did not think it was high for him.  He states that he has been compliant on his hypertensive medications.  He denies chest pain or palpitations.  Denies lower extremity swelling.  Denies nausea or diaphoresis.  Denies visual changes..  Review of Systems  Positive: See above Negative:   Physical Exam  BP (!) 211/102 (BP Location: Right Arm)   Pulse (!) 59   Temp 98.1 F (36.7 C) (Oral)   Resp 18   SpO2 99%  Gen:   Awake, no distress   Resp:  Normal effort  MSK:   Moves extremities without difficulty  Other:  Systolic murmur  Medical Decision Making  Medically screening exam initiated at 12:50 PM.  Appropriate orders placed.  Cesar Wilson was informed that the remainder of the evaluation will be completed by another provider, this initial triage assessment does not replace that evaluation, and the importance of remaining in the ED until their evaluation is complete.  Hypertensive here in triage with bradycardia   [name], PA-C 04/29/22 1251

## 2022-04-29 NOTE — Consult Note (Signed)
CARDIOLOGY CONSULT NOTE  Patient ID: Cesar Wilson MRN: [id number] DOB/AGE: 1950-09-25 71 y.o.  Admit date: 04/29/2022 Referring Physician: [facility] Reason for Consultation:  Complete AV block  HPI:   71 y.o. Caucasian male  with hypertension, controlled hypothyroidism, presented with with exertional dyspnea, lightheadedness  Patient is a Airline pilot, drove as recently as two days ago. He has been feeling lousy, having exertional dyspnea and lightheadedness for the past 3-4 days. He denies syncope, chest pain, leg edema, orthopnea, PND. His wife noticed that his pulse had been running in 30s and 40s. He is currently on diltiazem XL 240 mg daily, and metoprolol tartrate 50 mg-which he takes once a day.   On initial call to me from the ER, he was hypertensive, EKG had showed sinus rhythm w/1st degree AV block and intermittent 2:1 AV block. However, this has has since progressed to complete AV block. While at rest in bed, patient has no symptoms. BP remains elevated around 160s/70s mmHg.   Past Medical History:  Diagnosis Date   Anxiety    Biceps tendon rupture    left distal   GERD (gastroesophageal reflux disease)    Hammer toe of left foot    2nd and 3rd toes   Hypertension    under control; has been on med. x 23 yrs.     Past Surgical History:  Procedure Laterality Date   arm surgery Left    DISTAL BICEPS TENDON REPAIR  08/06/2011   Procedure: DISTAL BICEPS TENDON REPAIR;  Surgeon: Mable Paris, MD;  Location: [facility];  Service: Orthopedics;  Laterality: Left;   HAMMER TOE SURGERY Right 11/18/2018   Procedure: RIGHT FOOT 2ND AND 3RD HAMMER TOE CORRECTION;  Surgeon: Terance Hart, MD;  Location: [facility];  Service: Orthopedics;  Laterality: Right;   HAMMERTOE RECONSTRUCTION WITH WEIL OSTEOTOMY Left 09/23/2018   Procedure: LEFT SECOND AND THIRD HAMMER TOE CORRECTION  WITH WEIL OSTEOTOMY LEFT SECOND;   Surgeon: Terance Hart, MD;  Location: [facility];  Service: Orthopedics;  Laterality: Left;   ORIF ANKLE FRACTURE  12/08/2009   left lat. malleolus   TONSILLECTOMY AND ADENOIDECTOMY  as a child      Family History  Problem Relation Age of Onset   Parkinson's disease Mother    Dementia Mother    Leukemia Father      Social History: Social History   Socioeconomic History   Marital status: Married    Spouse name: Corporate treasurer   Number of children: 2   Years of education: College   Highest education level: Not on file  Occupational History   Occupation: [employer]  Tobacco Use   Smoking status: Former   Smokeless tobacco: Never   Tobacco comments:    quit smoking 20 yrs. ago  Substance and Sexual Activity   Alcohol use: No   Drug use: No   Sexual activity: Not on file  Other Topics Concern   Not on file  Social History Narrative   Right handed   Coffee daily   Lives with wife   Social Determinants of Health   Financial Resource Strain: Not on file  Food Insecurity: Not on file  Transportation Needs: Not on file  Physical Activity: Not on file  Stress: Not on file  Social Connections: Not on file  Intimate Partner Violence: Not on file     (Not in a hospital admission)   Review of Systems  Cardiovascular:  Positive for dyspnea on exertion. Negative for chest pain, leg swelling, palpitations and syncope.  Neurological:  Positive for light-headedness.      Physical Exam: Physical Exam Vitals and nursing note reviewed.  Constitutional:      General: He is not in acute distress. Neck:     Vascular: No JVD.  Cardiovascular:     Rate and Rhythm: Normal rate and regular rhythm.     Heart sounds: Murmur heard.     Harsh midsystolic murmur is present with a grade of 2/6 at the upper right sternal border radiating to the neck.  Pulmonary:     Effort: Pulmonary effort is normal.     Breath sounds: Normal breath sounds. No  wheezing or rales.  Musculoskeletal:     Right lower leg: No edema.     Left lower leg: No edema.        Imaging/tests reviewed and independently interpreted: Lab Results: CBC, BMP, trop HS, BNP  Cardiac Studies:  Telemetry 04/29/2022: Progressive worsening of AV conduction, now in complete AV block  EKG 04/29/2022: Sinus rhythm, 1st degree AV block---> Sinus rhythm with complete AV block, junctional rhythm in 30s  Echocardiogram: Preliminary review by me Normal EF Mild AS, mild MR   Assessment & Recommendations:  71 y.o. Caucasian male  with hypertension, controlled hypothyroidism, presented with with exertional dyspnea, lightheadedness  Complete AV block: Intermittent on presentation, now progressed to persistent. Likely cause of patients exertional dyspnea, as well as lab abnormalities of elevated BNP, Cr This could certainly be contributed by ongoing use of diltiazem xL 240 mg daily and and metoprolol tartrate 50 mg daily, but strongly suspect underlying degenerative conduction disease. Also check TSH. Given progression to complete AV block, recommend temporary pacemaker placement, while holding AV nodal blocking agents and await conduction recovery-if it were to occur. Will consult EP in am.  Benefits, risks, alternate options discussed with the patient-including but not limited to, 1-2% risk of bleeding, infection, vascular injury, hematoma, pneumothorax, myocardial injury causing pericardial effusion-tamponade, emergency use of chest tube/pericardial drain, death.  Hypertension: Discontinue AV nodal blocking agents. Will allow for higher blood pressure in the setting of complete AV block for now. Will add hydralazine for prn use after placement of temporary pacemaker while awaiting further decision on permanent pacemaker.    CRITICAL CARE Performed by: Truett Mainland   Total critical care time: 40 minutes   Critical care time was exclusive of separately  billable procedures and treating other patients.   Critical care was necessary to treat or prevent imminent or life-threatening deterioration.   Critical care was time spent personally by me on the following activities: development of treatment plan with patient and/or surrogate as well as nursing, discussions with consultants, evaluation of patient's response to treatment, examination of patient, obtaining history from patient or surrogate, ordering and performing treatments and interventions, ordering and review of laboratory studies, ordering and review of radiographic studies, pulse oximetry and re-evaluation of patient's condition.      Elder Negus, MD Pager: [phone] Office: [phone]

## 2022-04-29 NOTE — Progress Notes (Signed)
  Echocardiogram 2D Echocardiogram has been performed.  Delcie Roch 04/29/2022, 5:47 PM

## 2022-04-29 NOTE — ED Triage Notes (Signed)
Patient complains of exertional sob and intermittent dizziness for the past several days. Taking BP meds as prescribed. No symptoms at rest. Alert and oriented

## 2022-04-29 NOTE — ED Provider Notes (Signed)
[facility] EMERGENCY DEPARTMENT Provider Note   CSN: [id number] Arrival date & time: 04/29/22  1143     History  No chief complaint on file.   Cesar Wilson is a 71 y.o. male.  HPI   This patient is a 71 year old male with a history of hypertension on both diltiazem and metoprolol, he presents to the hospital with intermittent dyspnea and general weakness on exertion which has gotten much worse over the last couple of days.  This is a temporary feeling, it seems to come on with minimal exertion for example it did not come on with going upstairs but later when he was coming down the stairs he felt that, he sat down for about 10 minutes and it finally went away.  It is intermittent and he feels it intermittently in the ER as well while he is sitting in the bed.  This correlates with a block on the monitor.  The patient denies fevers chills coughing swelling of the legs chest pain or back pain.  No history of coronary disease, not anticoagulated.  Home Medications Prior to Admission medications   Medication Sig Start Date End Date Taking? Authorizing Provider  Ascorbic Acid (VITAMIN C) 1000 MG tablet Take 1,000 mg by mouth daily.    [provider]  cholecalciferol (VITAMIN D) 1000 UNITS tablet Take 1,000 Units by mouth daily.    [provider]  clonazePAM (KLONOPIN) 1 MG tablet Take 2 mg by mouth at bedtime. 11/07/16   [provider]  diltiazem (TIAZAC) 240 MG 24 hr capsule Take 240 mg by mouth daily.    [provider]  ibuprofen (ADVIL,MOTRIN) 400 MG tablet Take 400 mg by mouth every 6 (six) hours as needed for mild pain or fever.    [provider]  metoprolol succinate (TOPROL-XL) 50 MG 24 hr tablet Take 50 mg by mouth daily.    [provider]  Multiple Vitamins-Minerals (MULTIVITAMINS THER. W/MINERALS) TABS Take 1 tablet by mouth daily.    [provider]  naproxen (NAPROSYN) 500 MG tablet Take 500 mg  by mouth 2 (two) times daily with a meal.    [provider]  omeprazole (PRILOSEC) 20 MG capsule Take 20 mg by mouth daily.    [provider]  rizatriptan (MAXALT) 10 MG tablet Take 10 mg by mouth daily as needed for migraine.    [provider]      Allergies    Sulfa antibiotics, Zithromax [azithromycin], and Percocet [oxycodone-acetaminophen]    Review of Systems   Review of Systems  All other systems reviewed and are negative.   Physical Exam Updated Vital Signs BP (!) 197/92 (BP Location: Right Arm)   Pulse (!) 56   Temp 98.1 F (36.7 C) (Oral)   Resp 16   SpO2 100%  Physical Exam Vitals and nursing note reviewed.  Constitutional:      General: He is not in acute distress.    Appearance: He is well-developed.  HENT:     Head: Normocephalic and atraumatic.     Mouth/Throat:     Pharynx: No oropharyngeal exudate.  Eyes:     General: No scleral icterus.       Right eye: No discharge.        Left eye: No discharge.     Conjunctiva/sclera: Conjunctivae normal.     Pupils: Pupils are equal, round, and reactive to light.  Neck:     Thyroid: No thyromegaly.  Vascular: No JVD.  Cardiovascular:     Rate and Rhythm: Regular rhythm. Bradycardia present.     Heart sounds: Normal heart sounds. No murmur heard.    No friction rub. No gallop.  Pulmonary:     Effort: Pulmonary effort is normal. No respiratory distress.     Breath sounds: Normal breath sounds. No wheezing or rales.  Abdominal:     General: Bowel sounds are normal. There is no distension.     Palpations: Abdomen is soft. There is no mass.     Tenderness: There is no abdominal tenderness.  Musculoskeletal:        General: No tenderness or deformity. Normal range of motion.     Cervical back: Normal range of motion and neck supple.     Right lower leg: No edema.     Left lower leg: No edema.  Lymphadenopathy:     Cervical: No cervical adenopathy.  Skin:    General: Skin is  warm and dry.     Findings: No erythema or rash.  Neurological:     Mental Status: He is alert.     Coordination: Coordination normal.  Psychiatric:        Behavior: Behavior normal.     ED Results / Procedures / Treatments   Labs (all labs ordered are listed, but only abnormal results are displayed) Labs Reviewed  COMPREHENSIVE METABOLIC PANEL - Abnormal; Notable for the following components:      Result Value   Glucose, Bld 111 (*)    BUN 28 (*)    Creatinine, Ser 1.59 (*)    GFR, Estimated 46 (*)    All other components within normal limits  BRAIN NATRIURETIC PEPTIDE - Abnormal; Notable for the following components:   B Natriuretic Peptide 127.2 (*)    All other components within normal limits  CBG MONITORING, ED - Abnormal; Notable for the following components:   Glucose-Capillary 101 (*)    All other components within normal limits  CBC WITH DIFFERENTIAL/PLATELET  URINALYSIS, ROUTINE W REFLEX MICROSCOPIC  TSH  TROPONIN I (HIGH SENSITIVITY)  TROPONIN I (HIGH SENSITIVITY)    EKG EKG Interpretation  Date/Time:  Sunday April 29 2022 12:58:31 EDT Ventricular Rate:  52 PR Interval:  204 QRS Duration: 104 QT Interval:  462 QTC Calculation: 429 R Axis:   15 Text Interpretation: Sinus bradycardia Anteroseptal infarct , age undetermined Abnormal ECG When compared with ECG of 18-Sep-2018 07:11, PREVIOUS ECG IS PRESENT Confirmed by Eber Hong ([id number]) on 04/29/2022 3:13:40 PM  Radiology DG Chest 2 View  Result Date: 04/29/2022 CLINICAL DATA:  Shortness of breath EXAM: CHEST - 2 VIEW COMPARISON:  July 27, 2014 FINDINGS: Blunting of the right costophrenic angle is stable, likely pleural thickening. Mild atelectasis in the bases. The heart, hila, mediastinum, lungs, and pleura are otherwise unremarkable. IMPRESSION: No active cardiopulmonary disease. Electronically Signed   By: Gerome Sam III M.D.   On: 04/29/2022 13:47    Procedures .Critical Care  Performed  by: Eber Hong, MD Authorized by: Eber Hong, MD   Critical care provider statement:    Critical care time (minutes):  30   Critical care time was exclusive of:  Separately billable procedures and treating other patients and teaching time   Critical care was necessary to treat or prevent imminent or life-threatening deterioration of the following conditions:  Cardiac failure   Critical care was time spent personally by me on the following activities:  Development of treatment plan with patient or  surrogate, discussions with consultants, evaluation of patient's response to treatment, examination of patient, ordering and review of laboratory studies, ordering and review of radiographic studies, ordering and performing treatments and interventions, pulse oximetry, re-evaluation of patient's condition, review of old charts and obtaining history from patient or surrogate   I assumed direction of critical care for this patient from another provider in my specialty: no     Care discussed with: admitting provider   Comments:           Medications Ordered in ED Medications  hydrALAZINE (APRESOLINE) tablet 50 mg (has no administration in time range)  sodium chloride 0.9 % bolus 1,000 mL (has no administration in time range)    ED Course/ Medical Decision Making/ A&P                           Medical Decision Making Amount and/or Complexity of Data Reviewed Labs: ordered.  Risk Prescription drug management.   This patient presents to the ED for concern of generalized weakness and dyspnea on exertion, this involves an extensive number of treatment options, and is a complaint that carries with it a high risk of complications and morbidity.  The differential diagnosis includes coronary disease, pulmonary embolism, more likely to be related to arrhythmia.  This patient is on 2 different AV nodal blocking agents for hypertension and has had some intermittent bradycardia.  The significant other  has taken down his vital signs for the last couple of days and endorses multiple measurements at 50 or lower   Co morbidities that complicate the patient evaluation  Hypertension   Additional history obtained:  Additional history obtained from medical record including office visits and urgent care visits. External records from outside source obtained and reviewed including no prior recent hospitalizations No prior cardiology notes No prior echocardiograms   Lab Tests:  I Ordered, and personally interpreted labs.  The pertinent results include: CBC and metabolic panel, as well as troponin.  Troponin was negative, metabolic panel with renal insufficiency with a creatinine of 1.59, baseline is 1.08, this is a new acute kidney injury.  BUN is 28, this could be consistent with a dehydrated state.  CBC unremarkable, no signs of anemia or thrombocytopenia.   Imaging Studies ordered:  I ordered imaging studies including portable chest x-ray I independently visualized and interpreted imaging which showed no acute findings I agree with the radiologist interpretation   Cardiac Monitoring: / EKG:  The patient was maintained on a cardiac monitor.  I personally viewed and interpreted the cardiac monitored which showed an underlying rhythm of: The patient is intermittently bradycardic with what appears to be a heart block on the monitor     Consultations Obtained:  I requested consultation with the cardiology,  and discussed lab and imaging findings as well as pertinent plan - they recommend: Admit for Obs - hold AV nodal blocking agents Dr. Rosemary Holms will seein consult Hospitalist to admit.   Problem List / ED Course / Critical interventions / Medication management  The patient has intermittent bradycardia which I suspect is the cause of his dyspnea on exertion and weakness, he has double nodal block which I suspect is the primary cause though I cannot rule out underlying obstructive  disease.  I will discuss with cardiology, the patient will need to be taken off of these medications in the hospital tonight to observe for improvement, he also has some renal insufficiency and will be given some  IV fluids Treatment for hypertension (pressure of 211/102 on arrival) avoiding AV nodal blocking agents Placed on pacer pads in case the patient becomes persistently bradycardic I ordered medication including holding AV nodal blocking agents and giving IV fluids for dehydration bradycardia Reevaluation of the patient after these medicines showed that the patient unchanged in the emergency department I have reviewed the patients home medicines and have made adjustments as needed   Social Determinants of Health:  None   Test / Admission - Considered:  Need to be admitted on a cardiac monitor          Final Clinical Impression(s) / ED Diagnoses Final diagnoses:  Heart block  Dyspnea on exertion     Eber Hong, MD 04/29/22 1547

## 2022-04-29 NOTE — H&P (Addendum)
History and Physical    Cesar Wilson:[id number] DOB: 18-Mar-1951 DOA: 04/29/2022  PCP: Milus Height, PA (Confirm with patient/family/NH records and if not entered, this has to be entered at [facility] point of entry) Patient coming from: Home  I have personally briefly reviewed patient's old medical records in [facility]  Chief Complaint: Headache, shortness of breath.  HPI: Cesar Wilson is a 71 y.o. male with medical history significant of HTN, migraines, hypothyroidism, presented with worsening of lightheadedness and shortness of breath.  Symptoms started about 2 weeks ago, Cordery getting worse, and symptoms involved history of dyspnea, and frequent feeling of lightheadedness.  Denies any chest pain no cough no fever chills.  No leg swelling.  Patient has been taking both Cardizem and metoprolol for more than 10 years, no dosage adjustment recently.  Last night, patient had episode of severe lightheadedness and the wife checked his pulses 35 and blood pressure 170 SBP. ED Course: Blood pressure range from 30s to 50s, blood pressure maintains systolic> 190.  Checks x-ray clear no clear infiltrates.  EKG showed secondary gree AV block.  K4.1, creatinine 1.5, BUN 28.  Review of Systems: As per HPI otherwise 14 point review of systems negative.    Past Medical History:  Diagnosis Date   Anxiety    Biceps tendon rupture    left distal   GERD (gastroesophageal reflux disease)    Hammer toe of left foot    2nd and 3rd toes   Hypertension    under control; has been on med. x 23 yrs.    Past Surgical History:  Procedure Laterality Date   arm surgery Left    DISTAL BICEPS TENDON REPAIR  08/06/2011   Procedure: DISTAL BICEPS TENDON REPAIR;  Surgeon: Mable Paris, MD;  Location: [facility];  Service: Orthopedics;  Laterality: Left;   HAMMER TOE SURGERY Right 11/18/2018   Procedure: RIGHT FOOT 2ND AND 3RD HAMMER TOE CORRECTION;  Surgeon: Terance Hart, MD;  Location: [facility];  Service: Orthopedics;  Laterality: Right;   HAMMERTOE RECONSTRUCTION WITH WEIL OSTEOTOMY Left 09/23/2018   Procedure: LEFT SECOND AND THIRD HAMMER TOE CORRECTION  WITH WEIL OSTEOTOMY LEFT SECOND;  Surgeon: Terance Hart, MD;  Location: [facility];  Service: Orthopedics;  Laterality: Left;   ORIF ANKLE FRACTURE  12/08/2009   left lat. malleolus   TONSILLECTOMY AND ADENOIDECTOMY  as a child     reports that he has quit smoking. He has never used smokeless tobacco. He reports that he does not drink alcohol and does not use drugs.  Allergies  Allergen Reactions   Sulfa Antibiotics Hives   Zithromax [Azithromycin]     Elevated BP   Percocet [Oxycodone-Acetaminophen] Other (See Comments)    Keeps him awake, and does not relieve pain well    Family History  Problem Relation Age of Onset   Parkinson's disease Mother    Dementia Mother    Leukemia Father      Prior to Admission medications   Medication Sig Start Date End Date Taking? Authorizing Provider  albuterol (VENTOLIN HFA) 108 (90 Base) MCG/ACT inhaler Inhale 2 puffs into the lungs every 4 (four) hours as needed for shortness of breath. 11/25/21  Yes [provider]  diltiazem (CARDIZEM LA) 240 MG 24 hr tablet Take 240 mg by mouth daily. 01/29/22  Yes [provider]  levothyroxine (SYNTHROID) 50 MCG tablet Take 50 mcg by mouth every morning. 03/29/22  Yes [provider]  Ascorbic Acid (VITAMIN C) 1000 MG tablet Take 1,000 mg by mouth daily.    [provider]  cholecalciferol (VITAMIN D) 1000 UNITS tablet Take 1,000 Units by mouth daily.    [provider]  clonazePAM (KLONOPIN) 1 MG tablet Take 2 mg by mouth at bedtime. 11/07/16   [provider]  diltiazem (TIAZAC) 240 MG 24 hr capsule Take 240 mg by mouth daily.    [provider]  ibuprofen (ADVIL,MOTRIN) 400 MG tablet Take 400 mg by mouth every 6 (six)  hours as needed for mild pain or fever.    [provider]  metoprolol succinate (TOPROL-XL) 50 MG 24 hr tablet Take 50 mg by mouth daily.    [provider]  Multiple Vitamins-Minerals (MULTIVITAMINS THER. W/MINERALS) TABS Take 1 tablet by mouth daily.    [provider]  naproxen (NAPROSYN) 500 MG tablet Take 500 mg by mouth 2 (two) times daily with a meal.    [provider]  omeprazole (PRILOSEC) 20 MG capsule Take 20 mg by mouth daily.    [provider]  rizatriptan (MAXALT) 10 MG tablet Take 10 mg by mouth daily as needed for migraine.    [provider]    Physical Exam: Vitals:   04/29/22 1249 04/29/22 1520  BP: (!) 211/102 (!) 197/92  Pulse: (!) 59 (!) 56  Resp: 18 16  Temp: 98.1 F (36.7 C)   TempSrc: Oral   SpO2: 99% 100%    Constitutional: NAD, calm, comfortable Vitals:   04/29/22 1249 04/29/22 1520  BP: (!) 211/102 (!) 197/92  Pulse: (!) 59 (!) 56  Resp: 18 16  Temp: 98.1 F (36.7 C)   TempSrc: Oral   SpO2: 99% 100%   Eyes: PERRL, lids and conjunctivae normal ENMT: Mucous membranes are moist. Posterior pharynx clear of any exudate or lesions.Normal dentition.  Neck: normal, supple, no masses, no thyromegaly Respiratory: clear to auscultation bilaterally, no wheezing, no crackles. Normal respiratory effort. No accessory muscle use.  Cardiovascular: Regular rate and rhythm, no murmurs / rubs / gallops. No extremity edema. 2+ pedal pulses. No carotid bruits.  Abdomen: no tenderness, no masses palpated. No hepatosplenomegaly. Bowel sounds positive.  Musculoskeletal: no clubbing / cyanosis. No joint deformity upper and lower extremities. Good ROM, no contractures. Normal muscle tone.  Skin: no rashes, lesions, ulcers. No induration Neurologic: CN 2-12 grossly intact. Sensation intact, DTR normal. Strength 5/5 in all 4.  Psychiatric: Normal judgment and insight. Alert and oriented x 3. Normal mood.     Labs on  Admission: I have personally reviewed following labs and imaging studies  CBC: Recent Labs  Lab 04/29/22 1306  WBC 7.1  NEUTROABS 5.0  HGB 14.4  HCT 43.0  MCV 89.8  PLT 178   Basic Metabolic Panel: Recent Labs  Lab 04/29/22 1306  NA 138  K 4.1  CL 106  CO2 24  GLUCOSE 111*  BUN 28*  CREATININE 1.59*  CALCIUM 9.3   GFR: CrCl cannot be calculated (Unknown ideal weight.). Liver Function Tests: Recent Labs  Lab 04/29/22 1306  AST 20  ALT 21  ALKPHOS 74  BILITOT 0.5  PROT 6.6  ALBUMIN 4.0   No results for input(s): "LIPASE", "AMYLASE" in the last 168 hours. No results for input(s): "AMMONIA" in the last 168 hours. Coagulation Profile: No results for input(s): "INR", "PROTIME" in the last 168 hours. Cardiac Enzymes: No results for input(s): "CKTOTAL", "CKMB", "CKMBINDEX", "TROPONINI" in the  last 168 hours. BNP (last 3 results) No results for input(s): "PROBNP" in the last 8760 hours. HbA1C: No results for input(s): "HGBA1C" in the last 72 hours. CBG: Recent Labs  Lab 04/29/22 1304  GLUCAP 101*   Lipid Profile: No results for input(s): "CHOL", "HDL", "LDLCALC", "TRIG", "CHOLHDL", "LDLDIRECT" in the last 72 hours. Thyroid Function Tests: No results for input(s): "TSH", "T4TOTAL", "FREET4", "T3FREE", "THYROIDAB" in the last 72 hours. Anemia Panel: No results for input(s): "VITAMINB12", "FOLATE", "FERRITIN", "TIBC", "IRON", "RETICCTPCT" in the last 72 hours. Urine analysis:    Component Value Date/Time   COLORURINE STRAW (A) 12/29/2016 1830   APPEARANCEUR CLEAR 12/29/2016 1830   LABSPEC 1.004 (L) 12/29/2016 1830   PHURINE 5.0 12/29/2016 1830   GLUCOSEU NEGATIVE 12/29/2016 1830   HGBUR NEGATIVE 12/29/2016 1830   BILIRUBINUR NEGATIVE 12/29/2016 1830   KETONESUR NEGATIVE 12/29/2016 1830   PROTEINUR NEGATIVE 12/29/2016 1830   UROBILINOGEN 1.0 02/12/2009 0017   NITRITE NEGATIVE 12/29/2016 1830   LEUKOCYTESUR NEGATIVE 12/29/2016 1830    Radiological Exams  on Admission: DG Chest 2 View  Result Date: 04/29/2022 CLINICAL DATA:  Shortness of breath EXAM: CHEST - 2 VIEW COMPARISON:  July 27, 2014 FINDINGS: Blunting of the right costophrenic angle is stable, likely pleural thickening. Mild atelectasis in the bases. The heart, hila, mediastinum, lungs, and pleura are otherwise unremarkable. IMPRESSION: No active cardiopulmonary disease. Electronically Signed   By: Gerome Sam III M.D.   On: 04/29/2022 13:47    EKG: Independently reviewed.  Sinus, question of Mobitz type II versus third-degree AV block  Assessment/Plan Principal Problem:   2nd degree AV block Active Problems:   Second degree AV block  (please populate well all problems here in Problem List. (For example, if patient is on BP meds at home and you resume or decide to hold them, it is a problem that needs to be her. Same for CAD, COPD, HLD and so on)  Symptomatic bradycardia -Mobitz type II versus third-degree AV block, probably iatrogenic from combined use of AV blocking agent Cardizem and metoprolol. -Blood pressure maintains, hold off atropine for now.  Cardiology informed, Dr. Mikey Bussing will see patient emergently this afternoon. -Discontinue metoprolol and Cardizem. -As there is no other significant symptoms or signs of CHF, start patient on amlodipine 10 mg daily, and as needed hydralazine. -K> 4.0, check magnesium and phosphorus level, TSH sent. -Albuterol nebulizer x1 -Trop negative x1 and will order a baseline Echo.  AKI versus CKD -Last creatinine reading <1.0, 5 years ago -Clinically appears to be euvolemic, denies any urinary symptoms or back pain.  Check FeNa.  HTN -As above.  Migraine headache -No flareup.  DVT prophylaxis: Heparin subcu Code Status: Full code Family Communication: Wife at bedside Disposition Plan: Expect less than 2 midnight hospital stay Consults called: Cardiology Admission status: Tele obs   Emeline General MD [facility] Pager [phone]  04/29/2022, 4:14 PM

## 2022-04-30 ENCOUNTER — Encounter (HOSPITAL_COMMUNITY): Payer: Self-pay | Admitting: Cardiology

## 2022-04-30 DIAGNOSIS — I441 Atrioventricular block, second degree: Secondary | ICD-10-CM | POA: Diagnosis not present

## 2022-04-30 DIAGNOSIS — I442 Atrioventricular block, complete: Secondary | ICD-10-CM

## 2022-04-30 LAB — ECHOCARDIOGRAM COMPLETE
AR max vel: 1.62 cm2
AV Area VTI: 1.53 cm2
AV Area mean vel: 1.78 cm2
AV Mean grad: 16 mmHg
AV Peak grad: 37.5 mmHg
Ao pk vel: 3.06 m/s
Area-P 1/2: 3.06 cm2
MV M vel: 6.57 m/s
MV Peak grad: 172.7 mmHg
Radius: 0.5 cm
S' Lateral: 2.4 cm

## 2022-04-30 LAB — BASIC METABOLIC PANEL
Anion gap: 8 (ref 5–15)
BUN: 22 mg/dL (ref 8–23)
CO2: 24 mmol/L (ref 22–32)
Calcium: 9 mg/dL (ref 8.9–10.3)
Chloride: 110 mmol/L (ref 98–111)
Creatinine, Ser: 1.44 mg/dL — ABNORMAL HIGH (ref 0.61–1.24)
GFR, Estimated: 52 mL/min — ABNORMAL LOW (ref >60)
Glucose, Bld: 97 mg/dL (ref 70–99)
Potassium: 3.7 mmol/L (ref 3.5–5.1)
Sodium: 142 mmol/L (ref 135–145)

## 2022-04-30 LAB — MAGNESIUM: Magnesium: 1.9 mg/dL (ref 1.7–2.4)

## 2022-04-30 MED ORDER — ORAL CARE MOUTH RINSE: 15.0000 mL | OROMUCOSAL | Status: DC | PRN

## 2022-04-30 MED ORDER — HYDRALAZINE HCL 50 MG PO TABS
50.0000 mg | ORAL_TABLET | Freq: Three times a day (TID) | ORAL | Status: DC
  Administered 2022-04-30 – 2022-05-02 (×6): 50 mg via ORAL
  Filled 2022-04-30 (×6): qty 1

## 2022-04-30 MED ORDER — HYDRALAZINE HCL 20 MG/ML IJ SOLN: 10.0000 mg | Freq: Four times a day (QID) | INTRAMUSCULAR | Status: DC | PRN

## 2022-04-30 MED ORDER — LACTATED RINGERS IV SOLN: INTRAVENOUS | Status: AC

## 2022-04-30 NOTE — Consult Note (Signed)
Cardiology Consultation   Patient ID: Cesar Wilson MRN: [id number]; DOB: 05-Sep-1950  Admit date: 04/29/2022 Date of Consult: 04/30/2022  PCP:  Milus Height, PA   [facility] Providers Cardiologist:  new, Dr. Rosemary Holms   Patient Profile:   Cesar Wilson is a 71 y.o. male with a hx of hypothyroidism, HTN who is being seen 04/30/2022 for the evaluation of CHB at the request of Dr. Rosemary Holms.  History of Present Illness:   Mr. Cesar Wilson had been in his USOH, which is well and active at baseline, who for some days prior to coming in havig episodes of feeling weak, SOB unusually.  The day he came in he could hardly get up 5 steps, though after sitting down felt well again.  His wife finally insisted that he go to the ER.  In the ER observed to have 1st degree AVBlock  with some 2:1 and then intermittent CHB and cardiology called and placed a temp pacing wire. Home diltiazem and metoprolol held.  LABS K+ 4.1 BUN/Creat 28/1.59 > 1.44 (unknown baseline) Mag 1.9 BNP 127 HS Trop 9,8 WBC 7.1 H/H 14/43 Plts 178 TSH 2.674  Home meds include Diltiazem 240mg  daily Metop succ 50mg  daily He took both yesterday AM probably about 8-9AM   Past Medical History:  Diagnosis Date   Anxiety    Biceps tendon rupture    left distal   GERD (gastroesophageal reflux disease)    Hammer toe of left foot    2nd and 3rd toes   Hypertension    under control; has been on med. x 23 yrs.    Past Surgical History:  Procedure Laterality Date   arm surgery Left    DISTAL BICEPS TENDON REPAIR  08/06/2011   Procedure: DISTAL BICEPS TENDON REPAIR;  Surgeon: , MD;  Location: [facility];  Service: Orthopedics;  Laterality: Left;   HAMMER TOE SURGERY Right 11/18/2018   Procedure: RIGHT FOOT 2ND AND 3RD HAMMER TOE CORRECTION;  Surgeon: Mable Paris, MD;  Location: [facility];  Service: Orthopedics;  Laterality: Right;   HAMMERTOE  RECONSTRUCTION WITH WEIL OSTEOTOMY Left 09/23/2018   Procedure: LEFT SECOND AND THIRD HAMMER TOE CORRECTION  WITH WEIL OSTEOTOMY LEFT SECOND;  Surgeon: Terance Hart, MD;  Location: [facility];  Service: Orthopedics;  Laterality: Left;   ORIF ANKLE FRACTURE  12/08/2009   left lat. malleolus   TEMPORARY PACEMAKER N/A 04/29/2022   Procedure: TEMPORARY PACEMAKER;  Surgeon: 02/07/2010, MD;  Location: [facility];  Service: Cardiovascular;  Laterality: N/A;   TONSILLECTOMY AND ADENOIDECTOMY  as a child     Home Medications:  Prior to Admission medications   Medication Sig Start Date End Date Taking? Authorizing Provider  albuterol (VENTOLIN HFA) 108 (90 Base) MCG/ACT inhaler Inhale 2 puffs into the lungs every 4 (four) hours as needed for shortness of breath. 11/25/21  Yes [provider]  Ascorbic Acid (VITAMIN C) 1000 MG tablet Take 1,000 mg by mouth daily.   Yes [provider]  cholecalciferol (VITAMIN D) 1000 UNITS tablet Take 1,000 Units by mouth daily.   Yes [provider]  diltiazem (CARDIZEM LA) 240 MG 24 hr tablet Take 240 mg by mouth daily. 01/29/22  Yes [provider]  ibuprofen (ADVIL) 200 MG tablet Take 200 mg by mouth every 6 (six) hours as needed for mild pain.   Yes [provider]  levothyroxine (SYNTHROID) 50 MCG tablet  Take 50 mcg by mouth every morning. 03/29/22  Yes [provider]  metoprolol succinate (TOPROL-XL) 50 MG 24 hr tablet Take 50 mg by mouth daily.   Yes [provider]  Multiple Vitamins-Minerals (MULTIVITAMINS THER. W/MINERALS) TABS Take 1 tablet by mouth daily.   Yes [provider]  ranitidine (ZANTAC) 75 MG tablet Take 75 mg by mouth 2 (two) times daily.   Yes [provider]    Inpatient Medications: Scheduled Meds:  albuterol  2.5 mg Nebulization Once   amLODipine  10 mg Oral Daily   Chlorhexidine Gluconate Cloth  6 each Topical Daily    heparin  5,000 Units Subcutaneous Q8H   hydrALAZINE  50 mg Oral Q8H   levothyroxine  50 mcg Oral Q0600   pantoprazole  40 mg Oral Daily   sodium chloride flush  3 mL Intravenous Q12H   Continuous Infusions:  sodium chloride 10 mL/hr at 04/30/22 0800   lactated ringers     PRN Meds: sodium chloride, acetaminophen, albuterol, hydrALAZINE, ondansetron (ZOFRAN) IV, sodium chloride flush, SUMAtriptan  Allergies:    Allergies  Allergen Reactions   Sulfa Antibiotics Hives   Zithromax [Azithromycin]     Elevated BP   Percocet [Oxycodone-Acetaminophen] Other (See Comments)    Keeps him awake, and does not relieve pain well    Social History:   Social History   Socioeconomic History   Marital status: Married    Spouse name: Corporate treasurer   Number of children: 2   Years of education: College   Highest education level: Not on file  Occupational History   Occupation: [employer]  Tobacco Use   Smoking status: Former   Smokeless tobacco: Never   Tobacco comments:    quit smoking 20 yrs. ago  Substance and Sexual Activity   Alcohol use: No   Drug use: No   Sexual activity: Not on file  Other Topics Concern   Not on file  Social History Narrative   Right handed   Coffee daily   Lives with wife   Social Determinants of Health   Financial Resource Strain: Not on file  Food Insecurity: Not on file  Transportation Needs: Not on file  Physical Activity: Not on file  Stress: Not on file  Social Connections: Not on file  Intimate Partner Violence: Not on file    Family History:   Family History  Problem Relation Age of Onset   Parkinson's disease Mother    Dementia Mother    Leukemia Father      ROS:  Please see the history of present illness.  All other ROS reviewed and negative.     Physical Exam/Data:   Vitals:   04/30/22 0826 04/30/22 0919 04/30/22 1009 04/30/22 1100  BP:   (!) 141/93 (!) 147/91  Pulse:  (!) 58 61 61  Resp:   15 18  Temp: 98.1 F  (36.7 C)     TempSrc: Oral     SpO2:  98% 98% 94%    Intake/Output Summary (Last 24 hours) at 04/30/2022 1133 Last data filed at 04/30/2022 0800 Gross per 24 hour  Intake 129.58 ml  Output 400 ml  Net -270.42 ml      11/08/2020    8:14 AM 11/18/2018    6:43 AM 11/10/2018   10:26 AM  Last 3 Weights  Weight (lbs) 205 lb 197 lb 5 oz 193 lb 5.5 oz  Weight (kg) 92.987 kg 89.5 kg 87.7 kg  There is no height or weight on file to calculate BMI.  General:  Well nourished, well developed, in no acute distress HEENT: normal Neck: no JVD, temp wire R IJ, site is stable Vascular: No carotid bruits; Distal pulses 2+ bilaterally Cardiac:  RRR; no murmurs, gallops or rubs Lungs:  CTA b/l, no wheezing, rhonchi or rales  Abd: soft, nontender Ext: no edema Musculoskeletal:  No deformities Skin: warm and dry  Neuro:  no focal abnormalities noted Psych:  Normal affect   EKG:  The EKG was personally reviewed and demonstrates:    SB 52bpm, PR 204, QRS 104, no ischemic change 2:1 AVblock 40bpm,, unchanged QRS morphology SR with some V paced beats , PVC, 56bpm  Today SR 63bpm, normal intervals  Telemetry:  Telemetry was personally reviewed and demonstrates:   Largely SR 70's though has brief periods of 2:1 and while we are with him short episode of CHB towars 30's-40bpm  Relevant CV Studies:  04/29/22: TTE  1. Left ventricular ejection fraction, by estimation, is 65 to 70%. The  left ventricle has normal function. The left ventricle has no regional  wall motion abnormalities. Left ventricular diastolic function could not  be evaluated.   2. Right ventricular systolic function is normal. The right ventricular  size is normal. There is normal pulmonary artery systolic pressure.   3. The mitral valve is grossly normal. Mild to moderate mitral valve  regurgitation.   4. The aortic valve is calcified. Aortic valve regurgitation is moderate.  Aortic valve area, by VTI measures 1.53 cm.  Aortic valve mean gradient  measures 16.0 mmHg. Aortic valve Vmax measures 3.06 m/s.   Laboratory Data:  High Sensitivity Troponin:   Recent Labs  Lab 04/29/22 1306 04/29/22 1554  TROPONINIHS 9 8     Chemistry Recent Labs  Lab 04/29/22 1306 04/29/22 1554 04/30/22 0611  NA 138  --  142  K 4.1  --  3.7  CL 106  --  110  CO2 24  --  24  GLUCOSE 111*  --  97  BUN 28*  --  22  CREATININE 1.59*  --  1.44*  CALCIUM 9.3  --  9.0  MG  --  1.9 1.9  GFRNONAA 46*  --  52*  ANIONGAP 8  --  8    Recent Labs  Lab 04/29/22 1306  PROT 6.6  ALBUMIN 4.0  AST 20  ALT 21  ALKPHOS 74  BILITOT 0.5   Lipids No results for input(s): "CHOL", "TRIG", "HDL", "LABVLDL", "LDLCALC", "CHOLHDL" in the last 168 hours.  Hematology Recent Labs  Lab 04/29/22 1306  WBC 7.1  RBC 4.79  HGB 14.4  HCT 43.0  MCV 89.8  MCH 30.1  MCHC 33.5  RDW 13.1  PLT 178   Thyroid  Recent Labs  Lab 04/29/22 1554  TSH 2.674    BNP Recent Labs  Lab 04/29/22 1306  BNP 127.2*    DDimer No results for input(s): "DDIMER" in the last 168 hours.   Radiology/Studies:    Assessment and Plan:   Symptomatic bradycardia Advanced heart block, including CHB S/p temp wire If he continues to have periods of heart bock today>tomorrow he will need pacing Preserved LVEF, narrow QRS  Dr. Lalla Brothers has seen the patient Threshold on his temp wire is <48mA Keep back rate 30 NPO after MN  Risk Assessment/Risk Scores:   For questions or updates, please contact [facility] Please consult [email or web address] for contact info under  Signed, Sheilah Pigeon, PA-C  04/30/2022 11:33 AM

## 2022-04-30 NOTE — Evaluation (Signed)
Physical Therapy Evaluation/ Discharge Patient Details Name: Cesar Wilson MRN: [id number] DOB: 05/30/1951 Today's Date: 04/30/2022  History of Present Illness  71 yo male admitted 8/27 with SOB, lightheadedness, bradycardia and HTN with 2nd degree AV block. PMHx: HTN, migraines, hypothyroidism, Lt bicep tendon repair  Clinical Impression  Pt very pleasant and able to don socks EOB, brush teeth in standing and perform all basic transfers and in room gait without assist. Pt works 4 days a week, lives with wife and is independent at home. Pt with HR 56 with activity and no physical deficits requiring additional therapy at this time with pt aware and agreeable. Will sign off and encouraged continued gait with nursing as MD allows       Recommendations for follow up therapy are one component of a multi-disciplinary discharge planning process, led by the attending physician.  Recommendations may be updated based on patient status, additional functional criteria and insurance authorization.  Follow Up Recommendations No PT follow up      Assistance Recommended at Discharge None  Patient can return home with the following       Equipment Recommendations None recommended by PT  Recommendations for Other Services       Functional Status Assessment Patient has not had a recent decline in their functional status     Precautions / Restrictions Precautions Precautions: Other (comment) Precaution Comments: RIJ temp pacer      Mobility  Bed Mobility Overal bed mobility: Independent                  Transfers Overall transfer level: Independent                      Ambulation/Gait Ambulation/Gait assistance: Independent Gait Distance (Feet): 40 Feet Assistive device: None Gait Pattern/deviations: WFL(Within Functional Limits)   Gait velocity interpretation: >4.37 ft/sec, indicative of normal walking speed   General Gait Details: pt with good stability and  independence and limited to in room gait today per MD request with assist only for line management  Stairs            Wheelchair Mobility    Modified Rankin (Stroke Patients Only)       Balance Overall balance assessment: No apparent balance deficits (not formally assessed)                                           Pertinent Vitals/Pain Pain Assessment Pain Assessment: No/denies pain    Home Living Family/patient expects to be discharged to:: Private residence Living Arrangements: Spouse/significant other Available Help at Discharge: Family;Available PRN/intermittently Type of Home: House Home Access: Stairs to enter   [employer] of Steps: 5   Home Layout: One level Home Equipment: None Additional Comments: 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    Prior Function Prior Level of Function : Independent/Modified Independent                     Higher education careers adviser        Extremity/Trunk Assessment   Upper Extremity Assessment Upper Extremity Assessment: Overall WFL for tasks assessed    Lower Extremity Assessment Lower Extremity Assessment: Overall WFL for tasks assessed    Cervical / Trunk Assessment Cervical / Trunk Assessment: Normal  Communication   Communication: No difficulties  Cognition Arousal/Alertness: Awake/alert Behavior During Therapy: WFL for  tasks assessed/performed Overall Cognitive Status: Within Functional Limits for tasks assessed                                          General Comments      Exercises     Assessment/Plan    PT Assessment Patient does not need any further PT services  PT Problem List         PT Treatment Interventions      PT Goals (Current goals can be found in the Care Plan section)  Acute Rehab PT Goals PT Goal Formulation: All assessment and education complete, DC therapy    Frequency       Co-evaluation               AM-PAC PT  "6 Clicks" Mobility  Outcome Measure Help needed turning from your back to your side while in a flat bed without using bedrails?: None Help needed moving from lying on your back to sitting on the side of a flat bed without using bedrails?: None Help needed moving to and from a bed to a chair (including a wheelchair)?: None Help needed standing up from a chair using your arms (e.g., wheelchair or bedside chair)?: None Help needed to walk in hospital room?: None Help needed climbing 3-5 steps with a railing? : None 6 Click Score: 24    End of Session   Activity Tolerance: Patient tolerated treatment well Patient left: Other (comment);with call bell/phone within reach (in bathroom)   PT Visit Diagnosis: Other abnormalities of gait and mobility (R26.89)    Time: 9702-6378 PT Time Calculation (min) (ACUTE ONLY): 11 min   Charges:   PT Evaluation $PT Eval Moderate Complexity: 1 Mod          Nishika Parkhurst P, PT Acute Rehabilitation Services Office: [phone]   Enedina Finner Javian Nudd 04/30/2022, 9:21 AM

## 2022-04-30 NOTE — Progress Notes (Signed)
PROGRESS NOTE                                                                                                                                                                                                             Patient Demographics:    Cesar Wilson, is a 71 y.o. male, DOB - 1950/12/19, VPX:[id number]  Outpatient Primary MD for the patient is Red[place], [place], [place]    LOS - 1  Admit date - 04/29/2022    No chief complaint on file.      Brief Narrative (HPI from H&P)  - 71 y.o. male with medical history significant of HTN, migraines, hypothyroidism, presented with worsening of lightheadedness and shortness of breath, was found to have severe symptomatic bradycardia and admitted to the hospital.   Subjective:    Maureen Chatters today has, No headache, No chest pain, No abdominal pain - No Nausea, No new weakness tingling or numbness, No SOB, feels better.   Assessment  & Plan :   Symptomatic bradycardia with intermittent high degree complete heart block.  Likely brought on by AV blocking agents, he was on combination of Cardizem and beta-blocker.  Both offending medications held.  TSH echocardiogram stable.  He has been seen by cardiology underwent temporary pacemaker placement on 04/29/2022, with supportive care heart rate and blood pressure improving.  We will continue to monitor closely.  Advance activity.  PT OT.   AKI versus CKD  - due to hypotension, improving, continue to hydrate.  Monitor closely.  Discontinue NSAIDs.   HTN  - BP better placed on Norvasc and oral hydralazine for now we will continue to monitor and adjust.  Added as needed hydralazine.   Migraine headache  - No flareup.       Condition - Extremely Guarded  Family Communication  :  wife bedside 04/30/22  Code Status :  Full  Consults  :  Cards  PUD Prophylaxis : PPI   Procedures  :     Right IJ temporary pacemaker placement on 04/29/2022.     TTE - 1. Left ventricular ejection fraction, by estimation, is 65 to 70%. The left ventricle has normal function. The left ventricle has no regional wall motion abnormalities. Left ventricular diastolic function could not be evaluated.  2. Right ventricular systolic function is normal. The right ventricular size is normal.  There is normal pulmonary artery systolic pressure.  3. The mitral valve is grossly normal. Mild to moderate mitral valve regurgitation.  4. The aortic valve is calcified. Aortic valve regurgitation is moderate. Aortic valve area, by VTI measures 1.53 cm. Aortic valve mean gradient measures 16.0 mmHg. Aortic valve Vmax measures 3.06 m/s      Disposition Plan  :    Status is: Inpatient  DVT Prophylaxis  :    heparin injection 5,000 Units Start: 04/29/22 2200 SCD's Start: 04/29/22 1911    Lab Results  Component Value Date   PLT 178 04/29/2022    Diet :  Diet Order             Diet Heart Room service appropriate? Yes; Fluid consistency: Thin  Diet effective now                    Inpatient Medications  Scheduled Meds:  albuterol  2.5 mg Nebulization Once   amLODipine  10 mg Oral Daily   Chlorhexidine Gluconate Cloth  6 each Topical Daily   heparin  5,000 Units Subcutaneous Q8H   hydrALAZINE  50 mg Oral Q8H   levothyroxine  50 mcg Oral Q0600   pantoprazole  40 mg Oral Daily   sodium chloride flush  3 mL Intravenous Q12H   Continuous Infusions:  sodium chloride 10 mL/hr at 04/30/22 0800   lactated ringers     PRN Meds:.sodium chloride, acetaminophen, albuterol, hydrALAZINE, ondansetron (ZOFRAN) IV, sodium chloride flush, SUMAtriptan  Antibiotics  :    Anti-infectives (From admission, onward)    None        Time Spent in minutes  30   Susa Raring M.D on 04/30/2022 at 11:07 AM  To page go to [email or web address]   [facility] -  Office  [phone]  See all Orders from today for further details    Objective:   Vitals:    04/30/22 0700 04/30/22 0826 04/30/22 0919 04/30/22 1009  BP: (!) 142/78   (!) 141/93  Pulse: (!) 55  (!) 58   Resp: 15     Temp:  98.1 F (36.7 C)    TempSrc:  Oral    SpO2: 97%  98%     Wt Readings from Last 3 Encounters:  11/08/20 93 kg  11/18/18 89.5 kg  09/23/18 87.7 kg     Intake/Output Summary (Last 24 hours) at 04/30/2022 1107 Last data filed at 04/30/2022 0800 Gross per 24 hour  Intake 129.58 ml  Output 400 ml  Net -270.42 ml     Physical Exam  Awake Alert, No new F.N deficits, R.IJ Temp PM [place].AT,PERRAL Supple Neck, No JVD,   Symmetrical Chest wall movement, Good air movement bilaterally, CTAB RRR,No Gallops,Rubs or new Murmurs,  +ve B.Sounds, Abd Soft, No tenderness,   No Cyanosis, Clubbing or edema      Data Review:    CBC Recent Labs  Lab 04/29/22 1306  WBC 7.1  HGB 14.4  HCT 43.0  PLT 178  MCV 89.8  MCH 30.1  MCHC 33.5  RDW 13.1  LYMPHSABS 1.2  MONOABS 0.6  EOSABS 0.2  BASOSABS 0.0    Electrolytes Recent Labs  Lab 04/29/22 1306 04/29/22 1554 04/30/22 0611  NA 138  --  142  K 4.1  --  3.7  CL 106  --  110  CO2 24  --  24  GLUCOSE 111*  --  97  BUN 28*  --  22  CREATININE 1.59*  --  1.44*  CALCIUM 9.3  --  9.0  AST 20  --   --   ALT 21  --   --   ALKPHOS 74  --   --   BILITOT 0.5  --   --   ALBUMIN 4.0  --   --   MG  --  1.9 1.9  TSH  --  2.674  --   BNP 127.2*  --   --     Radiology Reports ECHOCARDIOGRAM COMPLETE  Result Date: 04/30/2022    ECHOCARDIOGRAM REPORT   Patient Name:   SEWARD CORAN Date of Exam: 04/29/2022 Medical Rec #:  [id number]       Height:       70.0 in Accession #:    [id number]      Weight:       205.0 lb Date of Birth:  14-Feb-1951       BSA:          2.109 m Patient Age:    71 years        BP:           197/92 mmHg Patient Gender: M               HR:           40 bpm. Exam Location:  Inpatient Procedure: 2D Echo Indications:     abnormal ekg  History:         Patient has no prior history of  Echocardiogram examinations.                  Arrythmias:heart block, Signs/Symptoms:Shortness of Breath;                  Risk Factors:Hypertension.  Sonographer:     Delcie Roch RDCS Referring Phys:  [id number] Emeline General Diagnosing Phys: Truett Mainland MD  Sonographer Comments: Several interruptions. IMPRESSIONS  1. Left ventricular ejection fraction, by estimation, is 65 to 70%. The left ventricle has normal function. The left ventricle has no regional wall motion abnormalities. Left ventricular diastolic function could not be evaluated.  2. Right ventricular systolic function is normal. The right ventricular size is normal. There is normal pulmonary artery systolic pressure.  3. The mitral valve is grossly normal. Mild to moderate mitral valve regurgitation.  4. The aortic valve is calcified. Aortic valve regurgitation is moderate. Aortic valve area, by VTI measures 1.53 cm. Aortic valve mean gradient measures 16.0 mmHg. Aortic valve Vmax measures 3.06 m/s. FINDINGS  Left Ventricle: Diastolic function not assessed due to AV block. Left ventricular ejection fraction, by estimation, is 65 to 70%. The left ventricle has normal function. The left ventricle has no regional wall motion abnormalities. The left ventricular internal cavity size was normal in size. There is no left ventricular hypertrophy. Left ventricular diastolic function could not be evaluated. Right Ventricle: The right ventricular size is normal. No increase in right ventricular wall thickness. Right ventricular systolic function is normal. There is normal pulmonary artery systolic pressure. The tricuspid regurgitant velocity is 2.34 m/s, and  with an assumed right atrial pressure of 3 mmHg, the estimated right ventricular systolic pressure is 24.9 mmHg. Left Atrium: Left atrial size was normal in size. Right Atrium: Right atrial size was normal in size. Pericardium: There is no evidence of pericardial effusion. Mitral Valve: The mitral  valve is grossly normal. Mild to moderate mitral valve regurgitation. Tricuspid Valve: The tricuspid valve is normal in structure. Tricuspid valve regurgitation  is mild . No evidence of tricuspid stenosis. Aortic Valve: The aortic valve is calcified. Aortic valve regurgitation is moderate. Aortic valve mean gradient measures 16.0 mmHg. Aortic valve peak gradient measures 37.5 mmHg. Aortic valve area, by VTI measures 1.53 cm. Pulmonic Valve: The pulmonic valve was normal in structure. Pulmonic valve regurgitation is not visualized. No evidence of pulmonic stenosis. Aorta: The aortic root is normal in size and structure. Venous: The inferior vena cava was not well visualized. IAS/Shunts: The interatrial septum was not assessed.  LEFT VENTRICLE PLAX 2D LVIDd:         4.60 cm   Diastology LVIDs:         2.40 cm   LV e' medial:   7.29 cm/s LV PW:         0.90 cm   LV E/e' medial: 21.1 LV IVS:        1.10 cm LVOT diam:     1.90 cm LV SV:         118 LV SV Index:   56 LVOT Area:     2.84 cm  RIGHT VENTRICLE RV S prime:     14.90 cm/s TAPSE (M-mode): 3.0 cm LEFT ATRIUM             Index        RIGHT ATRIUM           Index LA diam:        3.80 cm 1.80 cm/m   RA Area:     15.80 cm LA Vol (A2C):   57.3 ml 27.16 ml/m  RA Volume:   36.20 ml  17.16 ml/m LA Vol (A4C):   46.3 ml 21.95 ml/m LA Biplane Vol: 52.6 ml 24.94 ml/m  AORTIC VALVE AV Area (Vmax):    1.62 cm AV Area (Vmean):   1.78 cm AV Area (VTI):     1.53 cm AV Vmax:           306.00 cm/s AV Vmean:          178.000 cm/s AV VTI:            0.774 m AV Peak Grad:      37.5 mmHg AV Mean Grad:      16.0 mmHg LVOT Vmax:         175.00 cm/s LVOT Vmean:        112.000 cm/s LVOT VTI:          0.418 m LVOT/AV VTI ratio: 0.54  AORTA Ao Root diam: 3.40 cm Ao Asc diam:  3.50 cm MITRAL VALVE                  TRICUSPID VALVE MV Area (PHT): 3.06 cm       TR Peak grad:   21.9 mmHg MV Decel Time: 248 msec       TR Vmax:        234.00 cm/s MR Peak grad:    172.7 mmHg MR Mean  grad:    117.0 mmHg   SHUNTS MR Vmax:         657.00 cm/s  Systemic VTI:  0.42 m MR Vmean:        522.0 cm/s   Systemic Diam: 1.90 cm MR PISA:         1.57 cm MR PISA Eff ROA: 9 mm MR PISA Radius:  0.50 cm MV E velocity: 154.00 cm/s MV A velocity: 67.40 cm/s MV E/A ratio:  2.28 Truett MainlandManish Patwardhan MD Electronically signed  by Truett Mainland MD Signature Date/Time: 04/30/2022/10:21:25 AM    Final    DG CHEST PORT 1 VIEW  Result Date: 04/29/2022 CLINICAL DATA:  Temporary cardiac pacemaker EXAM: PORTABLE CHEST 1 VIEW COMPARISON:  04/29/2022 FINDINGS: Cardiac shadow is stable. Right jugular temporary pacing lead is noted. No pneumothorax is seen. The lungs are clear bilaterally. No bony abnormality is seen. IMPRESSION: Temporary pacing lead in place. No acute abnormality noted. Electronically Signed   By: Alcide Clever M.D.   On: 04/29/2022 19:45   CARDIAC CATHETERIZATION  Result Date: 04/29/2022 Images from the original result were not included. Intermittent complete AV block Successful temporary transvenous pacemaker placement in RT apex Elder Negus, MD Pager: [phone] Office: [phone]   DG Chest 2 View  Result Date: 04/29/2022 CLINICAL DATA:  Shortness of breath EXAM: CHEST - 2 VIEW COMPARISON:  July 27, 2014 FINDINGS: Blunting of the right costophrenic angle is stable, likely pleural thickening. Mild atelectasis in the bases. The heart, hila, mediastinum, lungs, and pleura are otherwise unremarkable. IMPRESSION: No active cardiopulmonary disease. Electronically Signed   By: Gerome Sam III M.D.   On: 04/29/2022 13:47

## 2022-04-30 NOTE — Progress Notes (Signed)
Subjective:  Feels great  Objective:  Vital Signs in the last 24 hours: Temp:  [97.7 F (36.5 C)-98.1 F (36.7 C)] 98.1 F (36.7 C) (08/28 0826) Pulse Rate:  [50-65] 58 (08/28 0919) Resp:  [12-42] 15 (08/28 0700) BP: (103-223)/(72-102) 141/93 (08/28 1009) SpO2:  [92 %-100 %] 98 % (08/28 0919)  Intake/Output from previous day: 08/27 0701 - 08/28 0700 In: 119.6 [I.V.:119.6] Out: 400 [Urine:400]  Physical Exam Vitals and nursing note reviewed.  Constitutional:      General: He is not in acute distress. Neck:     Vascular: No JVD.     Comments: RIJ temp pacer Cardiovascular:     Rate and Rhythm: Normal rate and regular rhythm.     Heart sounds: Normal heart sounds. No murmur heard. Pulmonary:     Effort: Pulmonary effort is normal.     Breath sounds: Normal breath sounds. No wheezing or rales.  Musculoskeletal:     Right lower leg: No edema.     Left lower leg: No edema.      Imaging/tests reviewed and independently interpreted: CXR 04/29/2022: Temporary pacing lead in place. No acute abnormality noted.    Cardiac Studies:  Telemetry 04/30/2022: Intermittent pacing overnight, mostly native 1:1 AV conduction this morning  Echocardiogram 04/29/2022:  1. Left ventricular ejection fraction, by estimation, is 65 to 70%. The  left ventricle has normal function. The left ventricle has no regional  wall motion abnormalities. Left ventricular diastolic function could not  be evaluated.   2. Right ventricular systolic function is normal. The right ventricular  size is normal. There is normal pulmonary artery systolic pressure.   3. The mitral valve is grossly normal. Mild to moderate mitral valve  regurgitation.   4. The aortic valve is calcified. Aortic valve regurgitation is moderate.  Aortic valve area, by VTI measures 1.53 cm. Aortic valve mean gradient  measures 16.0 mmHg. Aortic valve Vmax measures 3.06 m/s.    Assessment & Recommendations:  71 y.o. Caucasian  male  with hypertension, controlled hypothyroidism, presented with with exertional dyspnea, lightheadedness  Bradycardia: Intermittent complete AV block on presentation S/p tem pacer 8/27 Only intermittent AV pacing at this time. Holding diltiazem and metoprolol. TSH normal. Back up rate turned down to 30 bpm. If does not require pacing in next few hours, will consider removing temp pacer. Appreciate EP input.   Hypertension: BP controlled without metoprolol and diltiazem. I suspect some of the hypertension was compensatory in the setting of bradycardia on presentation.  CRITICAL CARE Performed by: Truett Mainland   Total critical care time: 30 minutes   Critical care time was exclusive of separately billable procedures and treating other patients.   Critical care was necessary to treat or prevent imminent or life-threatening deterioration.   Critical care was time spent personally by me on the following activities: development of treatment plan with patient and/or surrogate as well as nursing, discussions with consultants, evaluation of patient's response to treatment, examination of patient, obtaining history from patient or surrogate, ordering and performing treatments and interventions, ordering and review of laboratory studies, ordering and review of radiographic studies, pulse oximetry and re-evaluation of patient's condition.       Elder Negus, MD Pager: [phone] Office: [phone]

## 2022-04-30 NOTE — Progress Notes (Signed)
OT Cancellation Note  Patient Details Name: TAITE SCHOEPPNER MRN: [id number] DOB: 05-31-1951   Cancelled Treatment:    Reason Eval/Treat Not Completed: OT screened, no needs identified, will sign off. Received text from evaluating PT that pt is completely independent and no OT needs identified.   Ignacia Palma, OTR/L [facility] [phone] Office [phone]    Evette Georges 04/30/2022, 9:19 AM

## 2022-05-01 ENCOUNTER — Encounter (HOSPITAL_COMMUNITY)
Admission: EM | Disposition: A | Discharge status: Home / Self Care | Payer: Self-pay | Source: Home / Self Care | Attending: Cardiology

## 2022-05-01 ENCOUNTER — Telehealth: Payer: Self-pay

## 2022-05-01 DIAGNOSIS — I441 Atrioventricular block, second degree: Secondary | ICD-10-CM | POA: Diagnosis not present

## 2022-05-01 HISTORY — PX: PACEMAKER IMPLANT: EP1218

## 2022-05-01 LAB — BASIC METABOLIC PANEL
Anion gap: 6 (ref 5–15)
BUN: 22 mg/dL (ref 8–23)
CO2: 24 mmol/L (ref 22–32)
Calcium: 8.6 mg/dL — ABNORMAL LOW (ref 8.9–10.3)
Chloride: 110 mmol/L (ref 98–111)
Creatinine, Ser: 1.34 mg/dL — ABNORMAL HIGH (ref 0.61–1.24)
GFR, Estimated: 56 mL/min — ABNORMAL LOW (ref >60)
Glucose, Bld: 107 mg/dL — ABNORMAL HIGH (ref 70–99)
Potassium: 3.5 mmol/L (ref 3.5–5.1)
Sodium: 140 mmol/L (ref 135–145)

## 2022-05-01 LAB — SURGICAL PCR SCREEN
MRSA, PCR: NEGATIVE
Staphylococcus aureus: NEGATIVE

## 2022-05-01 LAB — CBC WITH DIFFERENTIAL/PLATELET
Abs Immature Granulocytes: 0.03 10*3/uL (ref 0.00–0.07)
Basophils Absolute: 0 10*3/uL (ref 0.0–0.1)
Basophils Relative: 1 %
Eosinophils Absolute: 0.3 10*3/uL (ref 0.0–0.5)
Eosinophils Relative: 4 %
HCT: 39.4 % (ref 39.0–52.0)
Hemoglobin: 14.1 g/dL (ref 13.0–17.0)
Immature Granulocytes: 0 %
Lymphocytes Relative: 15 %
Lymphs Abs: 1.2 10*3/uL (ref 0.7–4.0)
MCH: 31.1 pg (ref 26.0–34.0)
MCHC: 35.8 g/dL (ref 30.0–36.0)
MCV: 86.8 fL (ref 80.0–100.0)
Monocytes Absolute: 0.8 10*3/uL (ref 0.1–1.0)
Monocytes Relative: 11 %
Neutro Abs: 5.4 10*3/uL (ref 1.7–7.7)
Neutrophils Relative %: 69 %
Platelets: 143 10*3/uL — ABNORMAL LOW (ref 150–400)
RBC: 4.54 MIL/uL (ref 4.22–5.81)
RDW: 13.3 % (ref 11.5–15.5)
WBC: 7.7 10*3/uL (ref 4.0–10.5)
nRBC: 0 % (ref 0.0–0.2)

## 2022-05-01 LAB — LIPOPROTEIN A (LPA): Lipoprotein (a): <8.4 nmol/L (ref <75.0)

## 2022-05-01 LAB — BRAIN NATRIURETIC PEPTIDE: B Natriuretic Peptide: 82.1 pg/mL (ref 0.0–100.0)

## 2022-05-01 LAB — MAGNESIUM: Magnesium: 2.1 mg/dL (ref 1.7–2.4)

## 2022-05-01 SURGERY — PACEMAKER IMPLANT

## 2022-05-01 MED ORDER — LIDOCAINE HCL (PF) 1 % IJ SOLN
INTRAMUSCULAR | Status: AC
  Filled 2022-05-01: qty 30

## 2022-05-01 MED ORDER — SODIUM CHLORIDE 0.9 % IV SOLN
INTRAVENOUS | Status: AC
  Filled 2022-05-01: qty 2

## 2022-05-01 MED ORDER — FENTANYL CITRATE (PF) 100 MCG/2ML IJ SOLN
INTRAMUSCULAR | Status: DC | PRN
  Administered 2022-05-01 (×2): 25 ug via INTRAVENOUS

## 2022-05-01 MED ORDER — MIDAZOLAM HCL 5 MG/5ML IJ SOLN
INTRAMUSCULAR | Status: DC | PRN
  Administered 2022-05-01 (×2): 1 mg via INTRAVENOUS

## 2022-05-01 MED ORDER — CHLORHEXIDINE GLUCONATE 4 % EX LIQD
60.0000 mL | Freq: Once | CUTANEOUS | Status: AC
  Administered 2022-05-01: 4 via TOPICAL
  Filled 2022-05-01: qty 15

## 2022-05-01 MED ORDER — MIDAZOLAM HCL 5 MG/5ML IJ SOLN
INTRAMUSCULAR | Status: AC
  Filled 2022-05-01: qty 5

## 2022-05-01 MED ORDER — POTASSIUM CHLORIDE CRYS ER 20 MEQ PO TBCR
40.0000 meq | EXTENDED_RELEASE_TABLET | Freq: Once | ORAL | Status: AC
  Administered 2022-05-01: 40 meq via ORAL
  Filled 2022-05-01: qty 2

## 2022-05-01 MED ORDER — LIDOCAINE HCL (PF) 1 % IJ SOLN
INTRAMUSCULAR | Status: AC
Start: 2022-05-01 — End: ?
  Filled 2022-05-01: qty 30

## 2022-05-01 MED ORDER — HEPARIN (PORCINE) IN NACL 1000-0.9 UT/500ML-% IV SOLN
INTRAVENOUS | Status: AC
Start: 2022-05-01 — End: ?
  Filled 2022-05-01: qty 500

## 2022-05-01 MED ORDER — FENTANYL CITRATE (PF) 100 MCG/2ML IJ SOLN
INTRAMUSCULAR | Status: AC
  Filled 2022-05-01: qty 2

## 2022-05-01 MED ORDER — CEFAZOLIN SODIUM-DEXTROSE 2-4 GM/100ML-% IV SOLN
INTRAVENOUS | Status: AC
  Filled 2022-05-01: qty 100

## 2022-05-01 MED ORDER — SODIUM CHLORIDE 0.9 % IV SOLN: INTRAVENOUS | Status: DC

## 2022-05-01 MED ORDER — SODIUM CHLORIDE 0.9 % IV SOLN: 250.0000 mL | INTRAVENOUS | Status: DC

## 2022-05-01 MED ORDER — CHLORHEXIDINE GLUCONATE 4 % EX LIQD: 60.0000 mL | Freq: Once | CUTANEOUS | Status: DC

## 2022-05-01 MED ORDER — SODIUM CHLORIDE 0.9 % IV SOLN
80.0000 mg | INTRAVENOUS | Status: AC
  Administered 2022-05-01: 80 mg

## 2022-05-01 MED ORDER — LIDOCAINE HCL (PF) 1 % IJ SOLN
INTRAMUSCULAR | Status: DC | PRN
  Administered 2022-05-01: 60 mL

## 2022-05-01 MED ORDER — HEPARIN (PORCINE) IN NACL 1000-0.9 UT/500ML-% IV SOLN
INTRAVENOUS | Status: DC | PRN
  Administered 2022-05-01: 500 mL

## 2022-05-01 MED ORDER — CEFAZOLIN SODIUM-DEXTROSE 2-4 GM/100ML-% IV SOLN
2.0000 g | INTRAVENOUS | Status: AC
  Administered 2022-05-01: 2 g via INTRAVENOUS
  Filled 2022-05-01: qty 100

## 2022-05-01 MED ORDER — SODIUM CHLORIDE 0.9% FLUSH
3.0000 mL | Freq: Two times a day (BID) | INTRAVENOUS | Status: DC
Start: 2022-05-01 — End: 2022-05-01

## 2022-05-01 MED ORDER — SODIUM CHLORIDE 0.9% FLUSH: 3.0000 mL | INTRAVENOUS | Status: DC | PRN

## 2022-05-01 SURGICAL SUPPLY — 15 items
CABLE SURGICAL S-101-97-12 (CABLE) ×1 IMPLANT
CATH RIGHTSITE C315HIS02 (CATHETERS) IMPLANT
IPG PACE AZUR XT DR MRI W1DR01 (Pacemaker) IMPLANT
LEAD CAPSURE NOVUS 5076-52CM (Lead) IMPLANT
LEAD SELECT SECURE 3830 383069 (Lead) IMPLANT
PACE AZURE XT DR MRI W1DR01 (Pacemaker) ×1 IMPLANT
PAD DEFIB RADIO PHYSIO CONN (PAD) ×1 IMPLANT
POUCH AIGIS-R ANTIBACT PPM (Mesh General) ×1 IMPLANT
POUCH AIGIS-R ANTIBACT PPM MED (Mesh General) IMPLANT
SELECT SECURE 3830 383069 (Lead) ×1 IMPLANT
SHEATH 7FR PRELUDE SNAP 13 (SHEATH) IMPLANT
SHEATH PROBE COVER 6X72 (BAG) IMPLANT
SLITTER 6232ADJ (MISCELLANEOUS) IMPLANT
TRAY PACEMAKER INSERTION (PACKS) ×1 IMPLANT
WIRE HI TORQ VERSACORE-J 145CM (WIRE) IMPLANT

## 2022-05-01 NOTE — Telephone Encounter (Signed)
Pt has not been discharge yet

## 2022-05-01 NOTE — Progress Notes (Signed)
Rounding Note    Patient Name: Cesar Wilson Date of Encounter: 05/01/2022  [facility] Cardiologist: new, Dr. Rosemary Holms  Subjective   OOB to chair, no new complaints  Inpatient Medications    Scheduled Meds:  albuterol  2.5 mg Nebulization Once   amLODipine  10 mg Oral Daily   chlorhexidine  60 mL Topical Once   Chlorhexidine Gluconate Cloth  6 each Topical Daily   gentamicin (GARAMYCIN) 80 mg in sodium chloride 0.9 % 500 mL irrigation  80 mg Irrigation On Call   hydrALAZINE  50 mg Oral Q8H   levothyroxine  50 mcg Oral Q0600   pantoprazole  40 mg Oral Daily   potassium chloride  40 mEq Oral Once   Continuous Infusions:  sodium chloride      ceFAZolin (ANCEF) IV     PRN Meds: acetaminophen, albuterol, hydrALAZINE, ondansetron (ZOFRAN) IV, mouth rinse, SUMAtriptan   Vital Signs    Vitals:   05/01/22 0400 05/01/22 0500 05/01/22 0600 05/01/22 0728  BP: (!) 149/90 133/76 (!) 141/73   Pulse: (!) 53 62 65   Resp: 16 11 14    Temp:    97.7 F (36.5 C)  TempSrc:    Oral  SpO2: 96% 97% 96%   Weight:   90.4 kg     Intake/Output Summary (Last 24 hours) at 05/01/2022 0920 Last data filed at 05/01/2022 0600 Gross per 24 hour  Intake 1797.37 ml  Output 1020 ml  Net 777.37 ml      05/01/2022    6:00 AM 11/08/2020    8:14 AM 11/18/2018    6:43 AM  Last 3 Weights  Weight (lbs) 199 lb 4.7 oz 205 lb 197 lb 5 oz  Weight (kg) 90.4 kg 92.987 kg 89.5 kg      Telemetry    SR intermittent 2:1 continues with V pacing  - Personally Reviewed  ECG    No new EKGs - Personally Reviewed  Physical Exam    GEN: No acute distress.   Neck: No JVD R IJ temp wire site is stable Cardiac: RRR, no murmurs, rubs, or gallops.  Respiratory: Clear to auscultation bilaterally. GI: Soft, nontender, non-distended  MS: No edema; No deformity. Neuro:  Nonfocal  Psych: Normal affect   Labs    High Sensitivity Troponin:   Recent Labs  Lab 04/29/22 1306 04/29/22 1554   TROPONINIHS 9 8     Chemistry Recent Labs  Lab 04/29/22 1306 04/29/22 1554 04/30/22 0611 05/01/22 0612  NA 138  --  142 140  K 4.1  --  3.7 3.5  CL 106  --  110 110  CO2 24  --  24 24  GLUCOSE 111*  --  97 107*  BUN 28*  --  22 22  CREATININE 1.59*  --  1.44* 1.34*  CALCIUM 9.3  --  9.0 8.6*  MG  --  1.9 1.9 2.1  PROT 6.6  --   --   --   ALBUMIN 4.0  --   --   --   AST 20  --   --   --   ALT 21  --   --   --   ALKPHOS 74  --   --   --   BILITOT 0.5  --   --   --   GFRNONAA 46*  --  52* 56*  ANIONGAP 8  --  8 6    Lipids No results for input(s): "CHOL", "TRIG", "  HDL", "LABVLDL", "LDLCALC", "CHOLHDL" in the last 168 hours.  Hematology Recent Labs  Lab 04/29/22 1306 05/01/22 0612  WBC 7.1 7.7  RBC 4.79 4.54  HGB 14.4 14.1  HCT 43.0 39.4  MCV 89.8 86.8  MCH 30.1 31.1  MCHC 33.5 35.8  RDW 13.1 13.3  PLT 178 143*   Thyroid  Recent Labs  Lab 04/29/22 1554  TSH 2.674    BNP Recent Labs  Lab 04/29/22 1306 05/01/22 0612  BNP 127.2* 82.1    DDimer No results for input(s): "DDIMER" in the last 168 hours.   Radiology    ECHOCARDIOGRAM COMPLETE  Result Date: 04/30/2022    ECHOCARDIOGRAM REPORT   Patient Name:   CORDAY WYKA Date of Exam: 04/29/2022 Medical Rec #:  [id number]       Height:       70.0 in Accession #:    [id number]      Weight:       205.0 lb Date of Birth:  13-Sep-1950       BSA:          2.109 m Patient Age:    71 years        BP:           197/92 mmHg Patient Gender: M               HR:           40 bpm. Exam Location:  Inpatient Procedure: 2D Echo Indications:     abnormal ekg  History:         Patient has no prior history of Echocardiogram examinations.                  Arrythmias:heart block, Signs/Symptoms:Shortness of Breath;                  Risk Factors:Hypertension.  Sonographer:     Delcie Roch RDCS Referring Phys:  [id number] Emeline General Diagnosing Phys: Truett Mainland MD  Sonographer Comments: Several interruptions. IMPRESSIONS  1.  Left ventricular ejection fraction, by estimation, is 65 to 70%. The left ventricle has normal function. The left ventricle has no regional wall motion abnormalities. Left ventricular diastolic function could not be evaluated.  2. Right ventricular systolic function is normal. The right ventricular size is normal. There is normal pulmonary artery systolic pressure.  3. The mitral valve is grossly normal. Mild to moderate mitral valve regurgitation.  4. The aortic valve is calcified. Aortic valve regurgitation is moderate. Aortic valve area, by VTI measures 1.53 cm. Aortic valve mean gradient measures 16.0 mmHg. Aortic valve Vmax measures 3.06 m/s. FINDINGS  Left Ventricle: Diastolic function not assessed due to AV block. Left ventricular ejection fraction, by estimation, is 65 to 70%. The left ventricle has normal function. The left ventricle has no regional wall motion abnormalities. The left ventricular internal cavity size was normal in size. There is no left ventricular hypertrophy. Left ventricular diastolic function could not be evaluated. Right Ventricle: The right ventricular size is normal. No increase in right ventricular wall thickness. Right ventricular systolic function is normal. There is normal pulmonary artery systolic pressure. The tricuspid regurgitant velocity is 2.34 m/s, and  with an assumed right atrial pressure of 3 mmHg, the estimated right ventricular systolic pressure is 24.9 mmHg. Left Atrium: Left atrial size was normal in size. Right Atrium: Right atrial size was normal in size. Pericardium: There is no evidence of pericardial effusion. Mitral Valve: The mitral valve is  grossly normal. Mild to moderate mitral valve regurgitation. Tricuspid Valve: The tricuspid valve is normal in structure. Tricuspid valve regurgitation is mild . No evidence of tricuspid stenosis. Aortic Valve: The aortic valve is calcified. Aortic valve regurgitation is moderate. Aortic valve mean gradient measures 16.0  mmHg. Aortic valve peak gradient measures 37.5 mmHg. Aortic valve area, by VTI measures 1.53 cm. Pulmonic Valve: The pulmonic valve was normal in structure. Pulmonic valve regurgitation is not visualized. No evidence of pulmonic stenosis. Aorta: The aortic root is normal in size and structure. Venous: The inferior vena cava was not well visualized. IAS/Shunts: The interatrial septum was not assessed.  LEFT VENTRICLE PLAX 2D LVIDd:         4.60 cm   Diastology LVIDs:         2.40 cm   LV e' medial:   7.29 cm/s LV PW:         0.90 cm   LV E/e' medial: 21.1 LV IVS:        1.10 cm LVOT diam:     1.90 cm LV SV:         118 LV SV Index:   56 LVOT Area:     2.84 cm  RIGHT VENTRICLE RV S prime:     14.90 cm/s TAPSE (M-mode): 3.0 cm LEFT ATRIUM             Index        RIGHT ATRIUM           Index LA diam:        3.80 cm 1.80 cm/m   RA Area:     15.80 cm LA Vol (A2C):   57.3 ml 27.16 ml/m  RA Volume:   36.20 ml  17.16 ml/m LA Vol (A4C):   46.3 ml 21.95 ml/m LA Biplane Vol: 52.6 ml 24.94 ml/m  AORTIC VALVE AV Area (Vmax):    1.62 cm AV Area (Vmean):   1.78 cm AV Area (VTI):     1.53 cm AV Vmax:           306.00 cm/s AV Vmean:          178.000 cm/s AV VTI:            0.774 m AV Peak Grad:      37.5 mmHg AV Mean Grad:      16.0 mmHg LVOT Vmax:         175.00 cm/s LVOT Vmean:        112.000 cm/s LVOT VTI:          0.418 m LVOT/AV VTI ratio: 0.54  AORTA Ao Root diam: 3.40 cm Ao Asc diam:  3.50 cm MITRAL VALVE                  TRICUSPID VALVE MV Area (PHT): 3.06 cm       TR Peak grad:   21.9 mmHg MV Decel Time: 248 msec       TR Vmax:        234.00 cm/s MR Peak grad:    172.7 mmHg MR Mean grad:    117.0 mmHg   SHUNTS MR Vmax:         657.00 cm/s  Systemic VTI:  0.42 m MR Vmean:        522.0 cm/s   Systemic Diam: 1.90 cm MR PISA:         1.57 cm MR PISA Eff ROA: 9 mm MR PISA Radius:  0.50 cm  MV E velocity: 154.00 cm/s MV A velocity: 67.40 cm/s MV E/A ratio:  2.28 Truett Mainland MD Electronically signed by Truett Mainland MD Signature Date/Time: 04/30/2022/10:21:25 AM    Final    DG CHEST PORT 1 VIEW  Result Date: 04/29/2022 CLINICAL DATA:  Temporary cardiac pacemaker EXAM: PORTABLE CHEST 1 VIEW COMPARISON:  04/29/2022 FINDINGS: Cardiac shadow is stable. Right jugular temporary pacing lead is noted. No pneumothorax is seen. The lungs are clear bilaterally. No bony abnormality is seen. IMPRESSION: Temporary pacing lead in place. No acute abnormality noted. Electronically Signed   By: Alcide Clever M.D.   On: 04/29/2022 19:45   CARDIAC CATHETERIZATION  Result Date: 04/29/2022 Images from the original result were not included. Intermittent complete AV block Successful temporary transvenous pacemaker placement in RT apex Elder Negus, MD Pager: [phone] Office: [phone]   DG Chest 2 View  Result Date: 04/29/2022 CLINICAL DATA:  Shortness of breath EXAM: CHEST - 2 VIEW COMPARISON:  July 27, 2014 FINDINGS: Blunting of the right costophrenic angle is stable, likely pleural thickening. Mild atelectasis in the bases. The heart, hila, mediastinum, lungs, and pleura are otherwise unremarkable. IMPRESSION: No active cardiopulmonary disease. Electronically Signed   By: Gerome Sam III M.D.   On: 04/29/2022 13:47    Cardiac Studies   04/29/22: TTE  1. Left ventricular ejection fraction, by estimation, is 65 to 70%. The  left ventricle has normal function. The left ventricle has no regional  wall motion abnormalities. Left ventricular diastolic function could not  be evaluated.   2. Right ventricular systolic function is normal. The right ventricular  size is normal. There is normal pulmonary artery systolic pressure.   3. The mitral valve is grossly normal. Mild to moderate mitral valve  regurgitation.   4. The aortic valve is calcified. Aortic valve regurgitation is moderate.  Aortic valve area, by VTI measures 1.53 cm. Aortic valve mean gradient  measures 16.0 mmHg. Aortic valve Vmax  measures 3.06 m/s.   Patient Profile     71 y.o. male with a hx of hypothyroidism, HTN admitted with progressive weakness, fatigue and perhaps near syncope, found bradycardic, 2:1 and CHB  Assessment & Plan   Symptomatic bradycardia Advanced heart block, including CHB S/p temp wire If he continues to have periods of heart bock today>tomorrow he will need pacing Preserved LVEF, narrow QRS  Temp wire threshold remains excellent at <42mA He continues to have heart block despite medicine washout Will need PPM  Dr. Lalla Brothers has discussed with the patient and his wife bedside, rational for PPM recommendation, the procedure, potential benefits and complications He (they) are agreeable to proceed Will try to get done today.  3. AKI Trending downward    Further with attending cardiologist and IM teams  For questions or updates, please contact [facility] Please consult [email or web address] for contact info under        Signed, Sheilah Pigeon, PA-C  05/01/2022, 9:20 AM

## 2022-05-01 NOTE — Progress Notes (Signed)
PROGRESS NOTE                                                                                                                                                                                                             Patient Demographics:    Cesar Wilson, is a 71 y.o. male, DOB - [birth year], VZD:[id number]  Outpatient Primary MD for the patient is Redmon, [place], [place]    LOS - 2  Admit date - 04/29/2022    No chief complaint on file.      Brief Narrative (HPI from H&P)  - 71 y.o. male with medical history significant of HTN, migraines, hypothyroidism, presented with worsening of lightheadedness and shortness of breath, was found to have severe symptomatic bradycardia and admitted to the hospital.   Subjective:   Patient in bed, appears comfortable, denies any headache, no fever, no chest pain or pressure, no shortness of breath , no abdominal pain. No new focal weakness.   Assessment  & Plan :   Symptomatic bradycardia with intermittent high degree complete heart block.  he was on combination of Cardizem and beta-blocker.  Both offending medications held.  TSH echocardiogram stable.  He has been seen by cardiology underwent temporary pacemaker placement on 04/29/2022, his heart rate and blood pressure did improve but still has intermittent high degree block, EP following contemplating permanent pacemaker placement.   AKI versus CKD A  - due to hypotension, improving, continue to hydrate.  Monitor closely.  Discontinue NSAIDs.  Creatinine of under 1 but this is 5 years ago.  Could have developed CKD 3A in between.   HTN  - BP better placed on Norvasc and oral hydralazine for now we will continue to monitor and adjust.  Added as needed hydralazine.   Migraine headache  - No flareup.       Condition - Extremely Guarded  Family Communication  :  wife bedside 04/30/22, 05/01/2022  Code Status :  Full  Consults  :   Cards  PUD Prophylaxis : PPI   Procedures  :     Right IJ temporary pacemaker placement on 04/29/2022.    TTE - 1. Left ventricular ejection fraction, by estimation, is 65 to 70%. The left ventricle has normal function. The left ventricle has no regional wall motion abnormalities. Left ventricular diastolic function could not be evaluated.  2. Right ventricular systolic function is normal. The right ventricular size is normal. There is normal pulmonary artery systolic pressure.  3. The mitral valve is grossly normal. Mild to moderate mitral valve regurgitation.  4. The aortic valve is calcified. Aortic valve regurgitation is moderate. Aortic valve area, by VTI measures 1.53 cm. Aortic valve mean gradient measures 16.0 mmHg. Aortic valve Vmax measures 3.06 m/s      Disposition Plan  :    Status is: Inpatient  DVT Prophylaxis  :    Place and maintain sequential compression device Start: 05/01/22 0800 SCD's Start: 04/29/22 1911    Lab Results  Component Value Date   PLT 143 (L) 05/01/2022    Diet :  Diet Order             Diet NPO time specified Except for: Sips with Meds  Diet effective midnight                    Inpatient Medications  Scheduled Meds:  albuterol  2.5 mg Nebulization Once   amLODipine  10 mg Oral Daily   chlorhexidine  60 mL Topical Once   chlorhexidine  60 mL Topical Once   Chlorhexidine Gluconate Cloth  6 each Topical Daily   gentamicin (GARAMYCIN) 80 mg in sodium chloride 0.9 % 500 mL irrigation  80 mg Irrigation On Call   hydrALAZINE  50 mg Oral Q8H   levothyroxine  50 mcg Oral Q0600   pantoprazole  40 mg Oral Daily   potassium chloride  40 mEq Oral Once   Continuous Infusions:  sodium chloride      ceFAZolin (ANCEF) IV     PRN Meds:.acetaminophen, albuterol, hydrALAZINE, ondansetron (ZOFRAN) IV, mouth rinse, SUMAtriptan  Antibiotics  :    Anti-infectives (From admission, onward)    Start     Dose/Rate Route Frequency Ordered Stop    05/01/22 0900  gentamicin (GARAMYCIN) 80 mg in sodium chloride 0.9 % 500 mL irrigation        80 mg Irrigation On call 05/01/22 0801 05/02/22 0900   05/01/22 0900  ceFAZolin (ANCEF) IVPB 2g/100 mL premix        2 g 200 mL/hr over 30 Minutes Intravenous On call 05/01/22 0801 05/02/22 0900        Time Spent in minutes  30   Susa Raring M.D on 05/01/2022 at 8:45 AM  To page go to [email or web address]   [facility] -  Office  [phone]  See all Orders from today for further details    Objective:   Vitals:   05/01/22 0400 05/01/22 0500 05/01/22 0600 05/01/22 0728  BP: (!) 149/90 133/76 (!) 141/73   Pulse: (!) 53 62 65   Resp: 16 11 14    Temp:    97.7 F (36.5 C)  TempSrc:    Oral  SpO2: 96% 97% 96%   Weight:   90.4 kg     Wt Readings from Last 3 Encounters:  05/01/22 90.4 kg  11/08/20 93 kg  11/18/18 89.5 kg     Intake/Output Summary (Last 24 hours) at 05/01/2022 0845 Last data filed at 05/01/2022 0600 Gross per 24 hour  Intake 2167.37 ml  Output 1020 ml  Net 1147.37 ml     Physical Exam  Awake Alert, No new F.N deficits, R.IJ Temp PM [place].AT,PERRAL Supple Neck, No JVD,   Symmetrical Chest wall movement, Good air movement bilaterally, CTAB RRR,No Gallops, Rubs or new Murmurs,  +ve B.Sounds, Abd Soft, No  tenderness,   No Cyanosis, Clubbing or edema     Data Review:    CBC Recent Labs  Lab 04/29/22 1306 05/01/22 0612  WBC 7.1 7.7  HGB 14.4 14.1  HCT 43.0 39.4  PLT 178 143*  MCV 89.8 86.8  MCH 30.1 31.1  MCHC 33.5 35.8  RDW 13.1 13.3  LYMPHSABS 1.2 1.2  MONOABS 0.6 0.8  EOSABS 0.2 0.3  BASOSABS 0.0 0.0    Electrolytes Recent Labs  Lab 04/29/22 1306 04/29/22 1554 04/30/22 0611 05/01/22 0612  NA 138  --  142 140  K 4.1  --  3.7 3.5  CL 106  --  110 110  CO2 24  --  24 24  GLUCOSE 111*  --  97 107*  BUN 28*  --  22 22  CREATININE 1.59*  --  1.44* 1.34*  CALCIUM 9.3  --  9.0 8.6*  AST 20  --   --   --   ALT 21  --   --   --    ALKPHOS 74  --   --   --   BILITOT 0.5  --   --   --   ALBUMIN 4.0  --   --   --   MG  --  1.9 1.9 2.1  TSH  --  2.674  --   --   BNP 127.2*  --   --  82.1    Radiology Reports ECHOCARDIOGRAM COMPLETE  Result Date: 04/30/2022    ECHOCARDIOGRAM REPORT   Patient Name:   Cesar Wilson Date of Exam: 04/29/2022 Medical Rec #:  [id number]       Height:       70.0 in Accession #:    [id number]      Weight:       205.0 lb Date of Birth:  [birth year]       BSA:          2.109 m Patient Age:    71 years        BP:           197/92 mmHg Patient Gender: M               HR:           40 bpm. Exam Location:  Inpatient Procedure: 2D Echo Indications:     abnormal ekg  History:         Patient has no prior history of Echocardiogram examinations.                  Arrythmias:heart block, Signs/Symptoms:Shortness of Breath;                  Risk Factors:Hypertension.  Sonographer:     Delcie Roch RDCS Referring Phys:  [id number] Emeline General Diagnosing Phys: Truett Mainland MD  Sonographer Comments: Several interruptions. IMPRESSIONS  1. Left ventricular ejection fraction, by estimation, is 65 to 70%. The left ventricle has normal function. The left ventricle has no regional wall motion abnormalities. Left ventricular diastolic function could not be evaluated.  2. Right ventricular systolic function is normal. The right ventricular size is normal. There is normal pulmonary artery systolic pressure.  3. The mitral valve is grossly normal. Mild to moderate mitral valve regurgitation.  4. The aortic valve is calcified. Aortic valve regurgitation is moderate. Aortic valve area, by VTI measures 1.53 cm. Aortic valve mean gradient measures 16.0 mmHg. Aortic valve Vmax measures 3.06 m/s. FINDINGS  Left  Ventricle: Diastolic function not assessed due to AV block. Left ventricular ejection fraction, by estimation, is 65 to 70%. The left ventricle has normal function. The left ventricle has no regional wall motion  abnormalities. The left ventricular internal cavity size was normal in size. There is no left ventricular hypertrophy. Left ventricular diastolic function could not be evaluated. Right Ventricle: The right ventricular size is normal. No increase in right ventricular wall thickness. Right ventricular systolic function is normal. There is normal pulmonary artery systolic pressure. The tricuspid regurgitant velocity is 2.34 m/s, and  with an assumed right atrial pressure of 3 mmHg, the estimated right ventricular systolic pressure is 24.9 mmHg. Left Atrium: Left atrial size was normal in size. Right Atrium: Right atrial size was normal in size. Pericardium: There is no evidence of pericardial effusion. Mitral Valve: The mitral valve is grossly normal. Mild to moderate mitral valve regurgitation. Tricuspid Valve: The tricuspid valve is normal in structure. Tricuspid valve regurgitation is mild . No evidence of tricuspid stenosis. Aortic Valve: The aortic valve is calcified. Aortic valve regurgitation is moderate. Aortic valve mean gradient measures 16.0 mmHg. Aortic valve peak gradient measures 37.5 mmHg. Aortic valve area, by VTI measures 1.53 cm. Pulmonic Valve: The pulmonic valve was normal in structure. Pulmonic valve regurgitation is not visualized. No evidence of pulmonic stenosis. Aorta: The aortic root is normal in size and structure. Venous: The inferior vena cava was not well visualized. IAS/Shunts: The interatrial septum was not assessed.  LEFT VENTRICLE PLAX 2D LVIDd:         4.60 cm   Diastology LVIDs:         2.40 cm   LV e' medial:   7.29 cm/s LV PW:         0.90 cm   LV E/e' medial: 21.1 LV IVS:        1.10 cm LVOT diam:     1.90 cm LV SV:         118 LV SV Index:   56 LVOT Area:     2.84 cm  RIGHT VENTRICLE RV S prime:     14.90 cm/s TAPSE (M-mode): 3.0 cm LEFT ATRIUM             Index        RIGHT ATRIUM           Index LA diam:        3.80 cm 1.80 cm/m   RA Area:     15.80 cm LA Vol (A2C):   57.3  ml 27.16 ml/m  RA Volume:   36.20 ml  17.16 ml/m LA Vol (A4C):   46.3 ml 21.95 ml/m LA Biplane Vol: 52.6 ml 24.94 ml/m  AORTIC VALVE AV Area (Vmax):    1.62 cm AV Area (Vmean):   1.78 cm AV Area (VTI):     1.53 cm AV Vmax:           306.00 cm/s AV Vmean:          178.000 cm/s AV VTI:            0.774 m AV Peak Grad:      37.5 mmHg AV Mean Grad:      16.0 mmHg LVOT Vmax:         175.00 cm/s LVOT Vmean:        112.000 cm/s LVOT VTI:          0.418 m LVOT/AV VTI ratio: 0.54  AORTA Ao Root diam: 3.40  cm Ao Asc diam:  3.50 cm MITRAL VALVE                  TRICUSPID VALVE MV Area (PHT): 3.06 cm       TR Peak grad:   21.9 mmHg MV Decel Time: 248 msec       TR Vmax:        234.00 cm/s MR Peak grad:    172.7 mmHg MR Mean grad:    117.0 mmHg   SHUNTS MR Vmax:         657.00 cm/s  Systemic VTI:  0.42 m MR Vmean:        522.0 cm/s   Systemic Diam: 1.90 cm MR PISA:         1.57 cm MR PISA Eff ROA: 9 mm MR PISA Radius:  0.50 cm MV E velocity: 154.00 cm/s MV A velocity: 67.40 cm/s MV E/A ratio:  2.28 Manish Patwardhan MD Electronically signed by Truett MainlandManish Patwardhan MD Signature Date/Time: 04/30/2022/10:21:25 AM    Final    DG CHEST PORT 1 VIEW  Result Date: 04/29/2022 CLINICAL DATA:  Temporary cardiac pacemaker EXAM: PORTABLE CHEST 1 VIEW COMPARISON:  04/29/2022 FINDINGS: Cardiac shadow is stable. Right jugular temporary pacing lead is noted. No pneumothorax is seen. The lungs are clear bilaterally. No bony abnormality is seen. IMPRESSION: Temporary pacing lead in place. No acute abnormality noted. Electronically Signed   By: Alcide CleverMark  Lukens M.D.   On: 04/29/2022 19:45   CARDIAC CATHETERIZATION  Result Date: 04/29/2022 Images from the original result were not included. Intermittent complete AV block Successful temporary transvenous pacemaker placement in RT apex Elder NegusManish J Patwardhan, MD Pager: [phone] Office: [phone]   DG Chest 2 View  Result Date: 04/29/2022 CLINICAL DATA:  Shortness of breath EXAM: CHEST  - 2 VIEW COMPARISON:  July 27, 2014 FINDINGS: Blunting of the right costophrenic angle is stable, likely pleural thickening. Mild atelectasis in the bases. The heart, hila, mediastinum, lungs, and pleura are otherwise unremarkable. IMPRESSION: No active cardiopulmonary disease. Electronically Signed   By: Gerome Samavid  Williams III M.D.   On: 04/29/2022 13:47

## 2022-05-02 ENCOUNTER — Encounter (HOSPITAL_COMMUNITY): Payer: Self-pay | Admitting: Cardiology

## 2022-05-02 ENCOUNTER — Inpatient Hospital Stay (HOSPITAL_COMMUNITY): Payer: Medicare HMO

## 2022-05-02 DIAGNOSIS — Z950 Presence of cardiac pacemaker: Secondary | ICD-10-CM | POA: Diagnosis not present

## 2022-05-02 LAB — BASIC METABOLIC PANEL
Anion gap: 10 (ref 5–15)
BUN: 20 mg/dL (ref 8–23)
CO2: 22 mmol/L (ref 22–32)
Calcium: 8.8 mg/dL — ABNORMAL LOW (ref 8.9–10.3)
Chloride: 108 mmol/L (ref 98–111)
Creatinine, Ser: 1.29 mg/dL — ABNORMAL HIGH (ref 0.61–1.24)
GFR, Estimated: 59 mL/min — ABNORMAL LOW (ref >60)
Glucose, Bld: 112 mg/dL — ABNORMAL HIGH (ref 70–99)
Potassium: 3.5 mmol/L (ref 3.5–5.1)
Sodium: 140 mmol/L (ref 135–145)

## 2022-05-02 LAB — CBC WITH DIFFERENTIAL/PLATELET
Abs Immature Granulocytes: 0.03 10*3/uL (ref 0.00–0.07)
Basophils Absolute: 0 10*3/uL (ref 0.0–0.1)
Basophils Relative: 0 %
Eosinophils Absolute: 0.1 10*3/uL (ref 0.0–0.5)
Eosinophils Relative: 2 %
HCT: 40.8 % (ref 39.0–52.0)
Hemoglobin: 14 g/dL (ref 13.0–17.0)
Immature Granulocytes: 0 %
Lymphocytes Relative: 12 %
Lymphs Abs: 0.9 10*3/uL (ref 0.7–4.0)
MCH: 30 pg (ref 26.0–34.0)
MCHC: 34.3 g/dL (ref 30.0–36.0)
MCV: 87.6 fL (ref 80.0–100.0)
Monocytes Absolute: 1 10*3/uL (ref 0.1–1.0)
Monocytes Relative: 13 %
Neutro Abs: 5.6 10*3/uL (ref 1.7–7.7)
Neutrophils Relative %: 73 %
Platelets: 141 10*3/uL — ABNORMAL LOW (ref 150–400)
RBC: 4.66 MIL/uL (ref 4.22–5.81)
RDW: 13.6 % (ref 11.5–15.5)
WBC: 7.7 10*3/uL (ref 4.0–10.5)
nRBC: 0 % (ref 0.0–0.2)

## 2022-05-02 LAB — BRAIN NATRIURETIC PEPTIDE: B Natriuretic Peptide: 297.3 pg/mL — ABNORMAL HIGH (ref 0.0–100.0)

## 2022-05-02 LAB — MAGNESIUM: Magnesium: 1.8 mg/dL (ref 1.7–2.4)

## 2022-05-02 NOTE — Plan of Care (Signed)
  Problem: Education: Goal: Knowledge of General Education information will improve Description: Including pain rating scale, medication(s)/side effects and non-pharmacologic comfort measures Outcome: Progressing   Problem: Health Behavior/Discharge Planning: Goal: Ability to manage health-related needs will improve Outcome: Progressing   Problem: Clinical Measurements: Goal: Cardiovascular complication will be avoided Outcome: Progressing   Problem: Activity: Goal: Risk for activity intolerance will decrease Outcome: Progressing   Problem: Pain Managment: Goal: General experience of comfort will improve Outcome: Progressing   Problem: Safety: Goal: Ability to remain free from injury will improve Outcome: Progressing   Problem: Skin Integrity: Goal: Risk for impaired skin integrity will decrease Outcome: Progressing   Problem: Cardiovascular: Goal: Ability to achieve and maintain adequate cardiovascular perfusion will improve Outcome: Progressing

## 2022-05-02 NOTE — Discharge Instructions (Signed)
    Supplemental Discharge Instructions for  Pacemaker/Defibrillator Patients   Activity No heavy lifting or vigorous activity with your left/right arm for 6 to 8 weeks.  Do not raise your left/right arm above your head for one week.  Gradually raise your affected arm as drawn below.             05/06/22                       05/07/22                     05/08/22                   05/09/22 __  NO DRIVING for  1 week   ; you may begin driving on   12/08/02  .  WOUND CARE Keep the wound area clean and dry.  Do not get this area wet , no showers for one week; you may shower on   05/09/22  . The tape/steri-strips on your wound will fall off; do not pull them off.  No bandage is needed on the site.  DO  NOT apply any creams, oils, or ointments to the wound area. If you notice any drainage or discharge from the wound, any swelling or bruising at the site, or you develop a fever > 101? F after you are discharged home, call the office at once.  Special Instructions You are still able to use cellular telephones; use the ear opposite the side where you have your pacemaker/defibrillator.  Avoid carrying your cellular phone near your device. When traveling through airports, show security personnel your identification card to avoid being screened in the metal detectors.  Ask the security personnel to use the hand wand. Avoid arc welding equipment, MRI testing (magnetic resonance imaging), TENS units (transcutaneous nerve stimulators).  Call the office for questions about other devices. Avoid electrical appliances that are in poor condition or are not properly grounded. Microwave ovens are safe to be near or to operate.

## 2022-05-02 NOTE — Care Management Important Message (Signed)
Important Message  Patient Details  Name: Cesar Wilson MRN: [id number] Date of Birth: Jan 19, 1951   Medicare Important Message Given:  Yes     Renie Ora 05/02/2022, 9:45 AM

## 2022-05-02 NOTE — Discharge Summary (Signed)
Physician Discharge Summary  Patient ID: Cesar Wilson MRN: [id number] DOB/AGE: February 20, 1951 71 y.o.  Admit date: 04/29/2022 Discharge date: 05/02/2022  Primary Discharge Diagnosis: High grade AV block  Secondary Discharge Diagnosis: Hypertension Acute kidney injury  Hospital Course:   71 y.o. Caucasian male  with hypertension, hypothyroidism, now s/p PPM for high grade AV block.  Patient presented with lightheadedness symptoms, bradycardia. He was found to have intermittent high grade AV block, which persisted in spite of stopping diltiazem and metoprolol. Temp wire was placed on the day of admission, and eventually required PPM. Patient's symptoms resolved after pacemaker placement. Diltiazem was resumed on discharge. AKI and BNP elevation were secondry to high grade AV block, both improved on discharge.    Discharge Exam: Blood pressure (!) 144/76, pulse 94, temperature 98.1 F (36.7 C), temperature source Oral, resp. rate 17, weight 90.4 kg, SpO2 97 %.   Physical Exam Vitals and nursing note reviewed.  Constitutional:      General: He is not in acute distress. Neck:     Vascular: No JVD.  Cardiovascular:     Rate and Rhythm: Normal rate and regular rhythm.     Heart sounds: Normal heart sounds. No murmur heard.    Comments: Pacemaker site with no hematoma, bruising Pulmonary:     Effort: Pulmonary effort is normal.     Breath sounds: Normal breath sounds. No wheezing or rales.  Musculoskeletal:     Right lower leg: No edema.     Left lower leg: No edema.        Significant Diagnostic Studies:  EKG 05/02/2022: Sinus rhythm with ventricular pacing    FOLLOW UP PLANS AND APPOINTMENTS Discharge Instructions     Diet - low sodium heart healthy   Complete by: As directed    Discharge wound care:   Complete by: As directed    See AVS   Increase activity slowly   Complete by: As directed       Allergies as of 05/02/2022       Reactions   Sulfa Antibiotics  Hives   Zithromax [azithromycin]    Elevated BP   Percocet [oxycodone-acetaminophen] Other (See Comments)   Keeps him awake, and does not relieve pain well        Medication List     STOP taking these medications    ibuprofen 200 MG tablet Commonly known as: ADVIL   metoprolol succinate 50 MG 24 hr tablet Commonly known as: TOPROL-XL       TAKE these medications    albuterol 108 (90 Base) MCG/ACT inhaler Commonly known as: VENTOLIN HFA Inhale 2 puffs into the lungs every 4 (four) hours as needed for shortness of breath.   cholecalciferol 1000 units tablet Commonly known as: VITAMIN D Take 1,000 Units by mouth daily.   diltiazem 240 MG 24 hr tablet Commonly known as: CARDIZEM LA Take 240 mg by mouth daily.   levothyroxine 50 MCG tablet Commonly known as: SYNTHROID Take 50 mcg by mouth every morning.   multivitamins ther. w/minerals Tabs tablet Take 1 tablet by mouth daily.   ranitidine 75 MG tablet Commonly known as: ZANTAC Take 75 mg by mouth 2 (two) times daily.   vitamin C 1000 MG tablet Take 1,000 mg by mouth daily.               Discharge Care Instructions  (From admission, onward)           Start     Ordered  05/02/22 0000  Discharge wound care:       Comments: See AVS   05/02/22 2035            Follow-up Information     Elder Negus, MD Follow up on 05/16/2022.   Specialties: Cardiology, Radiology Why: 1:00 PM Contact information: [address] [place] [address] [phone]                   Elder Negus, MD Pager: [phone] Office: [phone]

## 2022-05-02 NOTE — Progress Notes (Addendum)
Rounding Note    Patient Name: Cesar Wilson Date of Encounter: 05/02/2022  [facility] Cardiologist: new, Dr. Rosemary Holms  Subjective   OOB to chair, no new complaints, though can feel his heart beat.  No CP, no SOB  Inpatient Medications    Scheduled Meds:  albuterol  2.5 mg Nebulization Once   amLODipine  10 mg Oral Daily   Chlorhexidine Gluconate Cloth  6 each Topical Daily   hydrALAZINE  50 mg Oral Q8H   levothyroxine  50 mcg Oral Q0600   pantoprazole  40 mg Oral Daily   Continuous Infusions:   PRN Meds: acetaminophen, albuterol, hydrALAZINE, ondansetron (ZOFRAN) IV, mouth rinse, SUMAtriptan   Vital Signs    Vitals:   05/01/22 1851 05/01/22 1950 05/02/22 0035 05/02/22 0435  BP: (!) 143/90 112/76 92/66 121/80  Pulse: 94 96 83 73  Resp: 16  17   Temp: 97.6 F (36.4 C) 98.4 F (36.9 C) 97.8 F (36.6 C) 98 F (36.7 C)  TempSrc: Oral Oral Oral Oral  SpO2:  97% 98% 96%  Weight:        Intake/Output Summary (Last 24 hours) at 05/02/2022 3846 Last data filed at 05/02/2022 0600 Gross per 24 hour  Intake 571.65 ml  Output 450 ml  Net 121.65 ml      05/01/2022    6:00 AM 11/08/2020    8:14 AM 11/18/2018    6:43 AM  Last 3 Weights  Weight (lbs) 199 lb 4.7 oz 205 lb 197 lb 5 oz  Weight (kg) 90.4 kg 92.987 kg 89.5 kg      Telemetry    SR/VP  - Personally Reviewed  ECG    SR/VP and AV paced beat, 80bpm, QRS 130- Personally Reviewed  Physical Exam    GEN: No acute distress.   Neck: No JVD, R IJ site is stable R IJ temp wire site is stable Cardiac: RRR, no murmurs, rubs, or gallops.  Respiratory: CTA b/l. GI: Soft, nontender, non-distended  MS: No edema; No deformity. Neuro:  Nonfocal  Psych: Normal affect   PPM site is stable, no bleeding, no hematoma  Labs    High Sensitivity Troponin:   Recent Labs  Lab 04/29/22 1306 04/29/22 1554  TROPONINIHS 9 8     Chemistry Recent Labs  Lab 04/29/22 1306 04/29/22 1554 04/30/22 0611  05/01/22 0612  NA 138  --  142 140  K 4.1  --  3.7 3.5  CL 106  --  110 110  CO2 24  --  24 24  GLUCOSE 111*  --  97 107*  BUN 28*  --  22 22  CREATININE 1.59*  --  1.44* 1.34*  CALCIUM 9.3  --  9.0 8.6*  MG  --  1.9 1.9 2.1  PROT 6.6  --   --   --   ALBUMIN 4.0  --   --   --   AST 20  --   --   --   ALT 21  --   --   --   ALKPHOS 74  --   --   --   BILITOT 0.5  --   --   --   GFRNONAA 46*  --  52* 56*  ANIONGAP 8  --  8 6    Lipids No results for input(s): "CHOL", "TRIG", "HDL", "LABVLDL", "LDLCALC", "CHOLHDL" in the last 168 hours.  Hematology Recent Labs  Lab 04/29/22 1306 05/01/22 0612 05/02/22 0701  WBC  7.1 7.7 7.7  RBC 4.79 4.54 4.66  HGB 14.4 14.1 14.0  HCT 43.0 39.4 40.8  MCV 89.8 86.8 87.6  MCH 30.1 31.1 30.0  MCHC 33.5 35.8 34.3  RDW 13.1 13.3 13.6  PLT 178 143* 141*   Thyroid  Recent Labs  Lab 04/29/22 1554  TSH 2.674    BNP Recent Labs  Lab 04/29/22 1306 05/01/22 0612  BNP 127.2* 82.1    DDimer No results for input(s): "DDIMER" in the last 168 hours.   Radiology     DG Chest 2 View Result Date: 05/02/2022 CLINICAL DATA:  Pacemaker placement EXAM: CHEST - 2 VIEW COMPARISON:  04/29/2022 FINDINGS: New pacemaker in place from a left subclavian approach with leads in the region the right atrium and right ventricle. No pneumothorax. The lungs are clear. Chronic pleural blunting on the right. Heart size is normal. Aortic atherosclerosis as seen previously. IMPRESSION: New pacemaker apparently well positioned without complication. Electronically Signed   By: Paulina Fusi M.D.   On: 05/02/2022 07:50      Cardiac Studies   04/29/22: TTE  1. Left ventricular ejection fraction, by estimation, is 65 to 70%. The  left ventricle has normal function. The left ventricle has no regional  wall motion abnormalities. Left ventricular diastolic function could not  be evaluated.   2. Right ventricular systolic function is normal. The right ventricular  size is  normal. There is normal pulmonary artery systolic pressure.   3. The mitral valve is grossly normal. Mild to moderate mitral valve  regurgitation.   4. The aortic valve is calcified. Aortic valve regurgitation is moderate.  Aortic valve area, by VTI measures 1.53 cm. Aortic valve mean gradient  measures 16.0 mmHg. Aortic valve Vmax measures 3.06 m/s.   Patient Profile     71 y.o. male with a hx of hypothyroidism, HTN admitted with progressive weakness, fatigue and perhaps near syncope, found bradycardic, 2:1 and CHB  Assessment & Plan   Symptomatic bradycardia Advanced heart block, including CHB S/p temp wire If he continues to have periods of heart bock today>tomorrow he will need pacing Preserved LVEF, narrow QRS  S/p PPM implant yesterday Site is stab;e R IJ site also stable Device check this AM with stable measurements CXR with no PTX and stable lead position Care instructions were reviewed with the patient OK for nodal blocking agents of needed for BP Usual EP follow up is in place  He has no CP, SOB, ? diaphragmatic stim Will have industry back to reduce lead output some.   3. AKI Trending downward yesterday    Further with attending cardiologist and IM teams Dr. Lalla Brothers has seen the patient Device rechecked, stable measurements In d/w Dr. Lalla Brothers, suspects more an acute cardiac awareness, rather then stim. In revisit with the patient He feels well, no longer feels his heart beat. OK to discharge   For questions or updates, please contact [facility] Please consult [email or web address] for contact info under        Signed, Sheilah Pigeon, PA-C  05/02/2022, 8:22 AM

## 2022-05-02 NOTE — Progress Notes (Signed)
Nsg Discharge Note  Admit Date:  04/29/2022 Discharge date: 05/02/2022   WEYMAN BOGDON to be D/C'd Home per MD order.  AVS completed. Patient/caregiver able to verbalize understanding.  Discharge Medication: Allergies as of 05/02/2022       Reactions   Sulfa Antibiotics Hives   Zithromax [azithromycin]    Elevated BP   Percocet [oxycodone-acetaminophen] Other (See Comments)   Keeps him awake, and does not relieve pain well        Medication List     STOP taking these medications    ibuprofen 200 MG tablet Commonly known as: ADVIL   metoprolol succinate 50 MG 24 hr tablet Commonly known as: TOPROL-XL       TAKE these medications    albuterol 108 (90 Base) MCG/ACT inhaler Commonly known as: VENTOLIN HFA Inhale 2 puffs into the lungs every 4 (four) hours as needed for shortness of breath.   cholecalciferol 1000 units tablet Commonly known as: VITAMIN D Take 1,000 Units by mouth daily.   diltiazem 240 MG 24 hr tablet Commonly known as: CARDIZEM LA Take 240 mg by mouth daily.   levothyroxine 50 MCG tablet Commonly known as: SYNTHROID Take 50 mcg by mouth every morning.   multivitamins ther. w/minerals Tabs tablet Take 1 tablet by mouth daily.   ranitidine 75 MG tablet Commonly known as: ZANTAC Take 75 mg by mouth 2 (two) times daily.   vitamin C 1000 MG tablet Take 1,000 mg by mouth daily.               Discharge Care Instructions  (From admission, onward)           Start     Ordered   05/02/22 0000  Discharge wound care:       Comments: See AVS   05/02/22 0758            Discharge Assessment: Vitals:   05/02/22 0435 05/02/22 0810  BP: 121/80 (!) 144/76  Pulse: 73 94  Resp:    Temp: 98 F (36.7 C) 98.1 F (36.7 C)  SpO2: 96% 97%   Skin clean, dry and intact without evidence of skin break down, no evidence of skin tears noted. IV catheter discontinued intact. Site without signs and symptoms of complications - no redness or  edema noted at insertion site, patient denies c/o pain - only slight tenderness at site.  Dressing with slight pressure applied.  D/c Instructions-Education: Discharge instructions given to patient/family with verbalized understanding. D/c education completed with patient/family including follow up instructions, medication list, d/c activities limitations if indicated, with other d/c instructions as indicated by MD - patient able to verbalize understanding, all questions fully answered. Patient instructed to return to ED, call 911, or call MD for any changes in condition.  Patient escorted via WC, and D/C home via private auto.  Kizzie Bane, RN 05/02/2022 10:42 AM

## 2022-05-04 NOTE — Telephone Encounter (Signed)
Location of hospitalization: [place] Reason for hospitalization: SOB and Dizzy  Date of discharge: 05/02/2022 Date of first communication with patient: today Person contacting patient: Me Current symptoms: NA Do you understand why you were in the Hospital: Yes Questions regarding discharge instructions: None Where were you discharged to: Home Medications reviewed: Yes Allergies reviewed: Yes Dietary changes reviewed: Yes. Discussed low fat and low salt diet.  Referals reviewed: NA Activities of Daily Living: Able to with mild limitations Any transportation issues/concerns: None Any patient concerns: None Confirmed importance & date/time of Follow up appt: Yes Confirmed with patient if condition begins to worsen call. Pt was given the office number and encouraged to call back with questions or concerns: Yes

## 2022-05-15 ENCOUNTER — Telehealth: Payer: Self-pay | Admitting: Cardiology

## 2022-05-15 NOTE — Telephone Encounter (Signed)
Called the patient and requested the whole form for Dr. Lalla Brothers to review, currently only have page 3-4.  Verbalized understanding.

## 2022-05-15 NOTE — Telephone Encounter (Signed)
Patient states he will be dropping off an insurance form today to be filled out by Dr. Lalla Brothers. He would like to pick it up 9/14 when he comes in for his wound check appointment.

## 2022-05-15 NOTE — Telephone Encounter (Signed)
Received full form. Completed Dr. Lovena Neighbours part. Awaiting MD signing.

## 2022-05-16 ENCOUNTER — Ambulatory Visit: Payer: Medicare HMO | Admitting: Cardiology

## 2022-05-16 ENCOUNTER — Encounter: Payer: Self-pay | Admitting: Cardiology

## 2022-05-16 VITALS — BP 187/96 | HR 65 | Temp 98.0°F | Resp 16 | Ht 70.0 in | Wt 209.0 lb

## 2022-05-16 DIAGNOSIS — I442 Atrioventricular block, complete: Secondary | ICD-10-CM | POA: Diagnosis not present

## 2022-05-16 DIAGNOSIS — I10 Essential (primary) hypertension: Secondary | ICD-10-CM | POA: Diagnosis not present

## 2022-05-16 MED ORDER — LOSARTAN POTASSIUM 50 MG PO TABS: 50.0000 mg | ORAL_TABLET | Freq: Every day | ORAL | 2 refills | Status: DC

## 2022-05-16 NOTE — Progress Notes (Signed)
TOC visit  Subjective:   Cesar Wilson, male    DOB: 07-Oct-1950, 71 y.o.   MRN: [id number]    HPI  Chief Complaint  Patient presents with   Hypertension   Follow-up    71 y.o. Caucasian male  with hypertension, hypothyroidism, now s/p PPM for high grade AV block.   Patient presented with lightheadedness symptoms, bradycardia. He was found to have intermittent high grade AV block, which persisted in spite of stopping diltiazem and metoprolol. Temp wire was placed on the day of admission, and eventually required PPM. Patient's symptoms resolved after pacemaker placement. Diltiazem was resumed on discharge. AKI and BNP elevation were secondry to high grade AV block, both improved on discharge.   Patient is here for follow up. He has no dyspnea, leg edema anymore., BP is high today, home BP is 150s/80s, much higher today in the office. He denies chest pain, dyspnea, headache, vision changes.     Current Outpatient Medications:    albuterol (VENTOLIN HFA) 108 (90 Base) MCG/ACT inhaler, Inhale 2 puffs into the lungs every 4 (four) hours as needed for shortness of breath., Disp: , Rfl:    Ascorbic Acid (VITAMIN C) 1000 MG tablet, Take 1,000 mg by mouth daily., Disp: , Rfl:    cholecalciferol (VITAMIN D) 1000 UNITS tablet, Take 1,000 Units by mouth daily., Disp: , Rfl:    diltiazem (CARDIZEM LA) 240 MG 24 hr tablet, Take 240 mg by mouth daily., Disp: , Rfl:    levothyroxine (SYNTHROID) 50 MCG tablet, Take 50 mcg by mouth every morning., Disp: , Rfl:    Multiple Vitamins-Minerals (MULTIVITAMINS THER. W/MINERALS) TABS, Take 1 tablet by mouth daily., Disp: , Rfl:    ranitidine (ZANTAC) 75 MG tablet, Take 75 mg by mouth 2 (two) times daily., Disp: , Rfl:    Cardiovascular & other pertient studies:  Reviewed external labs and tests, independently interpreted   EKG 05/02/2022: Sinus rhythm Ventricular pacing   Recent labs: 05/02/2022: Glucose 112, BUN/Cr 20/1.29. EGFR 59. Na/K  140/3.5.  BNP 297 H/H 14/40. MCV 87. Platelets 141 TSH 2.6 normal    Review of Systems  Cardiovascular:  Negative for chest pain, dyspnea on exertion, leg swelling, palpitations and syncope.         Vitals:   05/16/22 1154 05/16/22 1202  BP: (!) 209/107 (!) 210/111  Pulse: 77 79  Resp: 16   Temp: 98 F (36.7 C)   SpO2: 97%     Body mass index is 29.99 kg/m. Filed Weights   05/16/22 1154  Weight: 209 lb (94.8 kg)     Objective:   Physical Exam Vitals and nursing note reviewed.  Constitutional:      General: He is not in acute distress. Neck:     Vascular: No JVD.  Cardiovascular:     Rate and Rhythm: Normal rate and regular rhythm.     Heart sounds: Normal heart sounds. No murmur heard. Pulmonary:     Effort: Pulmonary effort is normal.     Breath sounds: Normal breath sounds. No wheezing or rales.  Musculoskeletal:     Right lower leg: No edema.     Left lower leg: No edema.             Visit diagnoses:   ICD-10-CM   1. Primary hypertension  I10 losartan (COZAAR) 50 MG tablet    2. Complete AV block (HCC)  I44.2        Meds ordered this encounter  Medications  losartan (COZAAR) 50 MG tablet    Sig: Take 1 tablet (50 mg total) by mouth daily.    Dispense:  30 tablet    Refill:  2     Assessment & Recommendations:    71 y.o. Caucasian male  with hypertension, hypothyroidism, now s/p PPM for high grade AV block.  High grade AV block: Now s/p PPM. Patient has multiple questions regarding level of activity with recent pacemaker placement.  He has wound check visit tomorrow with Dr. Mardene Speak office.  I encouraged him to discuss these questions at that visit.  Hypertension: Uncontrolled.  I do believe there is a component of whitecoat hypertension today, his home blood pressure readings are much lower than this.  Blood pressure equal in both arms.  I gave him a sample tablet of Edarbi 80 mg in the office, with which blood pressure came  down marginally.  I started him on losartan 50 mg daily outpatient.  Continue diltiazem 240 mg daily.  Repeat BMP in 1 week.  Follow-up blood pressure check visit in 2 weeks       Nigel Mormon, MD Pager: [phone] Office: [phone]

## 2022-05-17 ENCOUNTER — Ambulatory Visit: Payer: Medicare HMO | Attending: Internal Medicine

## 2022-05-17 DIAGNOSIS — I442 Atrioventricular block, complete: Secondary | ICD-10-CM

## 2022-05-17 LAB — CUP PACEART INCLINIC DEVICE CHECK
Battery Remaining Longevity: 136 mo
Battery Voltage: 3.21 V
Brady Statistic AP VP Percent: 13.94 %
Brady Statistic AP VS Percent: 0.36 %
Brady Statistic AS VP Percent: 83.68 %
Brady Statistic AS VS Percent: 2.02 %
Brady Statistic RA Percent Paced: 14.28 %
Brady Statistic RV Percent Paced: 97.62 %
Date Time Interrogation Session: 20230914145825
Implantable Lead Implant Date: 20230829
Implantable Lead Implant Date: 20230829
Implantable Lead Location: 753859
Implantable Lead Location: 753860
Implantable Lead Model: 3830
Implantable Lead Model: 5076
Implantable Pulse Generator Implant Date: 20230829
Lead Channel Impedance Value: 342 Ohm
Lead Channel Impedance Value: 380 Ohm
Lead Channel Impedance Value: 475 Ohm
Lead Channel Impedance Value: 551 Ohm
Lead Channel Pacing Threshold Amplitude: 0.625 V
Lead Channel Pacing Threshold Amplitude: 0.75 V
Lead Channel Pacing Threshold Amplitude: 0.75 V
Lead Channel Pacing Threshold Amplitude: 1 V
Lead Channel Pacing Threshold Pulse Width: 0.4 ms
Lead Channel Pacing Threshold Pulse Width: 0.4 ms
Lead Channel Pacing Threshold Pulse Width: 0.4 ms
Lead Channel Pacing Threshold Pulse Width: 0.4 ms
Lead Channel Sensing Intrinsic Amplitude: 1.5 mV
Lead Channel Sensing Intrinsic Amplitude: 1.875 mV
Lead Channel Sensing Intrinsic Amplitude: 10.625 mV
Lead Channel Sensing Intrinsic Amplitude: 9.5 mV
Lead Channel Setting Pacing Amplitude: 3.5 V
Lead Channel Setting Pacing Amplitude: 3.5 V
Lead Channel Setting Pacing Pulse Width: 0.4 ms
Lead Channel Setting Sensing Sensitivity: 0.9 mV

## 2022-05-17 NOTE — Patient Instructions (Signed)
   After Your Pacemaker   Monitor your pacemaker site for redness, swelling, and drainage. Call the device clinic at [phone] if you experience these symptoms or fever/chills.  Your incision was closed with Steri-strips or staples:  You may shower 7 days after your procedure and wash your incision with soap and water. Avoid lotions, ointments, or perfumes over your incision until it is well-healed.  You may use a hot tub or a pool after your wound check appointment if the incision is completely closed.  Do not lift, push or pull greater than 10 pounds with the affected arm until 6 weeks after your procedure. There are no other restrictions in arm movement after your wound check appointment. June 13, 2022.  You may drive, unless driving has been restricted by your healthcare providers.   Remote monitoring is used to monitor your pacemaker from home. This monitoring is scheduled every 91 days by our office. It allows [place] to keep an eye on the functioning of your device to ensure it is working properly. You will routinely see your Electrophysiologist annually (more often if necessary).

## 2022-05-18 NOTE — Telephone Encounter (Signed)
MD signed. Paper work was given to device nurse to return to the patient during wound check.

## 2022-05-24 ENCOUNTER — Other Ambulatory Visit: Payer: Self-pay | Admitting: Cardiology

## 2022-05-24 DIAGNOSIS — I10 Essential (primary) hypertension: Secondary | ICD-10-CM | POA: Diagnosis not present

## 2022-05-25 LAB — BASIC METABOLIC PANEL
BUN/Creatinine Ratio: 17 (ref 10–24)
BUN: 22 mg/dL (ref 8–27)
CO2: 21 mmol/L (ref 20–29)
Calcium: 9.4 mg/dL (ref 8.6–10.2)
Chloride: 100 mmol/L (ref 96–106)
Creatinine, Ser: 1.26 mg/dL (ref 0.76–1.27)
Glucose: 91 mg/dL (ref 70–99)
Potassium: 4.1 mmol/L (ref 3.5–5.2)
Sodium: 138 mmol/L (ref 134–144)
eGFR: 61 mL/min/{1.73_m2} (ref >59)

## 2022-05-31 ENCOUNTER — Ambulatory Visit: Payer: Medicare HMO | Admitting: Cardiology

## 2022-05-31 VITALS — BP 148/97 | HR 82

## 2022-05-31 DIAGNOSIS — I10 Essential (primary) hypertension: Secondary | ICD-10-CM

## 2022-05-31 NOTE — Progress Notes (Signed)
Cesar Wilson is  71yo WM with hypertension, hypothyroidism and PPM for high grade AV block. He presents today for a follow-up blood pressure check in office. Notably patient takes his BP at home twice daily with a wrist cuff. Patient was started on losartan 50mg  qPM at the last office visit and continues to take diltiazem 240mg  qAM.  Vitals:   05/31/22 0835 05/31/22 0846  BP: (!) 181/92 (!) 148/97  Pulse: 82     BP was elevated in office this morning, with repeat vitals on home device showing a decrease in BP. Patient's home BP trends are much lower than what he initially presented with today however still not at goal.  Enrolled patient in PCM (not RPM with device). He and his wife are very on top of his readings. Patient is to bring in a printout of all home vitals to better assess trends and make medication adjustments if necessary.   Haynes Dage, [facility]

## 2022-06-08 DIAGNOSIS — I10 Essential (primary) hypertension: Secondary | ICD-10-CM | POA: Diagnosis not present

## 2022-06-20 ENCOUNTER — Telehealth: Payer: Self-pay

## 2022-06-20 DIAGNOSIS — I10 Essential (primary) hypertension: Secondary | ICD-10-CM

## 2022-06-20 NOTE — Telephone Encounter (Signed)
Following patient through [facility] for BP management. Below are the most recent readings from patient's home BP device.  Current Meds  Medication Sig   diltiazem (CARDIZEM LA) 240 MG 24 hr tablet Take 240 mg by mouth daily.   losartan (COZAAR) 50 MG tablet Take 1 tablet (50 mg total) by mouth daily.     DATE               TIME            SBP     DBP     PUL 06/18/2022 2:26 PM 141 84 65 06/17/2022 8:47 PM 149 83 68 06/17/2022 9:49 AM 161 89 68 06/16/2022 8:19 PM 142 84 68 06/14/2022 9:27 AM 163 93 71 06/13/2022 9:42 PM 131 79 62 06/12/2022 9:08 PM 140 82 64 06/11/2022 8:41 PM 149 84 66 06/09/2022 9:42 AM 138 85 69 06/08/2022 9:55 PM 159 85 67 06/08/2022 8:29 AM 146 93 74 06/07/2022 9:48 PM 144 85 64 06/07/2022 2:07 PM 142 79 64 06/05/2022 8:28 PM 153 84 70 06/05/2022 2:02 PM 160 79 64

## 2022-06-21 ENCOUNTER — Other Ambulatory Visit: Payer: Self-pay

## 2022-06-21 DIAGNOSIS — I10 Essential (primary) hypertension: Secondary | ICD-10-CM

## 2022-06-21 MED ORDER — LOSARTAN POTASSIUM 100 MG PO TABS: 100.0000 mg | ORAL_TABLET | Freq: Every evening | ORAL | 0 refills | Status: DC

## 2022-06-21 NOTE — Telephone Encounter (Signed)
Can increase losartan to 100 and repeat BMP in 1 week.  Thanks MJP

## 2022-06-21 NOTE — Progress Notes (Signed)
Due to home BP readings, increasing losartan 50mg  qPM to 100mg  qPM.   BMP for 1 week.  Patient aware of change and labs to be done.

## 2022-06-27 DIAGNOSIS — I10 Essential (primary) hypertension: Secondary | ICD-10-CM | POA: Diagnosis not present

## 2022-06-28 LAB — BASIC METABOLIC PANEL
BUN/Creatinine Ratio: 19 (ref 10–24)
BUN: 24 mg/dL (ref 8–27)
CO2: 23 mmol/L (ref 20–29)
Calcium: 9.4 mg/dL (ref 8.6–10.2)
Chloride: 102 mmol/L (ref 96–106)
Creatinine, Ser: 1.27 mg/dL (ref 0.76–1.27)
Glucose: 112 mg/dL — ABNORMAL HIGH (ref 70–99)
Potassium: 4.3 mmol/L (ref 3.5–5.2)
Sodium: 140 mmol/L (ref 134–144)
eGFR: 60 mL/min/{1.73_m2} (ref >59)

## 2022-07-09 DIAGNOSIS — I10 Essential (primary) hypertension: Secondary | ICD-10-CM | POA: Diagnosis not present

## 2022-07-31 ENCOUNTER — Other Ambulatory Visit: Payer: Self-pay | Admitting: Cardiology

## 2022-07-31 DIAGNOSIS — I10 Essential (primary) hypertension: Secondary | ICD-10-CM

## 2022-08-03 ENCOUNTER — Telehealth: Payer: Self-pay

## 2022-08-03 NOTE — Telephone Encounter (Signed)
Patient is enrolled in [facility] for hypertension management. He sends in BP readings from his home device every two weeks.   Current regimen:   Losartan 100mg  qPM  Diltiazem 240mg  qAM  DATE TIME SYS DIA PUL 08/01/2022 8:11 PM 140 75 71 07/31/2022 8:12 PM 149 82 71 07/31/2022 6:43 PM 154 80 74 07/30/2022 5:50 AM 142 88 68 07/29/2022 8:20 PM 137 76 63 07/20/2022 7:03 AM 141 83 69 07/19/2022 8:17 PM 140 79 62 07/18/2022 9:05 PM 136 82 64 07/17/2022 9:02 PM 139 77 64 07/17/2022 9:00 PM 139 77 64 07/12/2022 9:02 PM 131 78 66 07/11/2022 9:27 PM 125 69 64 07/10/2022 8:57 PM 138 79 61 07/08/2022 7:03 PM 153 79 67 07/07/2022 10:26 PM 128 78 69 07/06/2022 10:03 PM 141 81 65 07/05/2022 9:14 PM 138 72 64 07/04/2022 10:04 PM 135 77 66 07/03/2022 8:31 PM 142 79 65

## 2022-08-03 NOTE — Telephone Encounter (Signed)
Will discuss further at upcoming office visit on 12/14.  Thanks MJP

## 2022-08-12 NOTE — Progress Notes (Unsigned)
Electrophysiology Office Follow up Visit Note:    Date:  08/12/2022   ID:  Cesar Wilson, DOB 1951/01/14, MRN [id number]  PCP:  Milus Height, PA  [facility] Cardiologist:  None  [facility] Electrophysiologist:  Lanier Prude, MD    Interval History:    Cesar Wilson is a 71 y.o. male who presents for a follow up visit. He had a PPM implanted 05/01/2022 for CHB. Device interrogations have shown stable device function. On the most recent interrogation there was ~ 90 seconds of AF.        Past Medical History:  Diagnosis Date   Anxiety    Biceps tendon rupture    left distal   GERD (gastroesophageal reflux disease)    Hammer toe of left foot    2nd and 3rd toes   Hypertension    under control; has been on med. x 23 yrs.    Past Surgical History:  Procedure Laterality Date   arm surgery Left    DISTAL BICEPS TENDON REPAIR  08/06/2011   Procedure: DISTAL BICEPS TENDON REPAIR;  Surgeon: Mable Paris, MD;  Location: [facility];  Service: Orthopedics;  Laterality: Left;   HAMMER TOE SURGERY Right 11/18/2018   Procedure: RIGHT FOOT 2ND AND 3RD HAMMER TOE CORRECTION;  Surgeon: Terance Hart, MD;  Location: [facility];  Service: Orthopedics;  Laterality: Right;   HAMMERTOE RECONSTRUCTION WITH WEIL OSTEOTOMY Left 09/23/2018   Procedure: LEFT SECOND AND THIRD HAMMER TOE CORRECTION  WITH WEIL OSTEOTOMY LEFT SECOND;  Surgeon: Terance Hart, MD;  Location: [facility];  Service: Orthopedics;  Laterality: Left;   ORIF ANKLE FRACTURE  12/08/2009   left lat. malleolus   PACEMAKER IMPLANT N/A 05/01/2022   Procedure: PACEMAKER IMPLANT;  Surgeon: Lanier Prude, MD;  Location: [facility];  Service: Cardiovascular;  Laterality: N/A;   TEMPORARY PACEMAKER N/A 04/29/2022   Procedure: TEMPORARY PACEMAKER;  Surgeon: Elder Negus, MD;  Location: [facility];  Service: Cardiovascular;   Laterality: N/A;   TONSILLECTOMY AND ADENOIDECTOMY  as a child    Current Medications: No outpatient medications have been marked as taking for the 08/13/22 encounter (Appointment) with Lanier Prude, MD.     Allergies:   Sulfa antibiotics, Zithromax [azithromycin], and Percocet [oxycodone-acetaminophen]   Social History   Socioeconomic History   Marital status: Married    Spouse name: Corporate treasurer   Number of children: 2   Years of education: College   Highest education level: Not on file  Occupational History   Occupation: [employer]  Tobacco Use   Smoking status: Former    Packs/day: 2.00    Years: 15.00    Total pack years: 30.00    Types: Cigarettes    Quit date: 1992    Years since quitting: 31.9   Smokeless tobacco: Never   Tobacco comments:    quit smoking 20 yrs. ago  Vaping Use   Vaping Use: Never used  Substance and Sexual Activity   Alcohol use: No   Drug use: No   Sexual activity: Not on file  Other Topics Concern   Not on file  Social History Narrative   Right handed   Coffee daily   Lives with wife   Social Determinants of Health   Financial Resource Strain: Not on file  Food Insecurity: Not on file  Transportation Needs: Not on file  Physical Activity: Not on file  Stress: Not on file  Social Connections: Not on file     Family History: The patient's family history includes Dementia in his mother; Leukemia in his father; Parkinson's disease in his mother.  ROS:   Please see the history of present illness.    All other systems reviewed and are negative.  EKGs/Labs/Other Studies Reviewed:    The following studies were reviewed today:  08/12/2022 in clinic device interrogation personally reviewed ***    Recent Labs: 04/29/2022: ALT 21; TSH 2.674 05/02/2022: B Natriuretic Peptide 297.3; Hemoglobin 14.0; Magnesium 1.8; Platelets 141 06/27/2022: BUN 24; Creatinine, Ser 1.27; Potassium 4.3; Sodium 140  Recent Lipid  Panel No results found for: "CHOL", "TRIG", "HDL", "CHOLHDL", "VLDL", "LDLCALC", "LDLDIRECT"  Physical Exam:    VS:  There were no vitals taken for this visit.    Wt Readings from Last 3 Encounters:  05/16/22 209 lb (94.8 kg)  05/01/22 199 lb 4.7 oz (90.4 kg)  11/08/20 205 lb (93 kg)     GEN: *** Well nourished, well developed in no acute distress HEENT: Normal NECK: No JVD; No carotid bruits LYMPHATICS: No lymphadenopathy CARDIAC: ***RRR, no murmurs, rubs, gallops. Prepectoral pocket well healed. RESPIRATORY:  Clear to auscultation without rales, wheezing or rhonchi  ABDOMEN: Soft, non-tender, non-distended MUSCULOSKELETAL:  No edema; No deformity  SKIN: Warm and dry NEUROLOGIC:  Alert and oriented x 3 PSYCHIATRIC:  Normal affect        ASSESSMENT:    1. Complete AV block (HCC)   2. Primary hypertension   3. Cardiac pacemaker in situ    PLAN:    In order of problems listed above:   #CHB #PPM in situ Device functioning appropriately. Continue remote monitoring.  #Hypertension *** goal today.  Recommend checking blood pressures 1-2 times per week at home and recording the values.  Recommend bringing these recordings to the primary care physician.         Total time spent with patient today *** minutes. This includes reviewing records, evaluating the patient and coordinating care.   Medication Adjustments/Labs and Tests Ordered: Current medicines are reviewed at length with the patient today.  Concerns regarding medicines are outlined above.  No orders of the defined types were placed in this encounter.  No orders of the defined types were placed in this encounter.    Signed, Steffanie Dunn, MD, [facility], [facility] 08/12/2022 8:39 PM    Electrophysiology [facility]

## 2022-08-13 ENCOUNTER — Encounter: Payer: Self-pay | Admitting: Cardiology

## 2022-08-13 ENCOUNTER — Ambulatory Visit (INDEPENDENT_AMBULATORY_CARE_PROVIDER_SITE_OTHER): Payer: Medicare HMO

## 2022-08-13 ENCOUNTER — Ambulatory Visit: Payer: Medicare HMO | Attending: Cardiology | Admitting: Cardiology

## 2022-08-13 VITALS — BP 128/82 | HR 71 | Ht 71.0 in | Wt 203.2 lb

## 2022-08-13 DIAGNOSIS — I442 Atrioventricular block, complete: Secondary | ICD-10-CM

## 2022-08-13 DIAGNOSIS — I10 Essential (primary) hypertension: Secondary | ICD-10-CM | POA: Diagnosis not present

## 2022-08-13 DIAGNOSIS — Z950 Presence of cardiac pacemaker: Secondary | ICD-10-CM | POA: Diagnosis not present

## 2022-08-13 LAB — CUP PACEART REMOTE DEVICE CHECK
Battery Remaining Longevity: 157 mo
Battery Voltage: 3.19 V
Brady Statistic AP VP Percent: 28.11 %
Brady Statistic AP VS Percent: 0.66 %
Brady Statistic AS VP Percent: 70.4 %
Brady Statistic AS VS Percent: 0.83 %
Brady Statistic RA Percent Paced: 28.76 %
Brady Statistic RV Percent Paced: 98.51 %
Date Time Interrogation Session: 20231211052244
Implantable Lead Connection Status: 753985
Implantable Lead Connection Status: 753985
Implantable Lead Implant Date: 20230829
Implantable Lead Implant Date: 20230829
Implantable Lead Location: 753859
Implantable Lead Location: 753860
Implantable Lead Model: 3830
Implantable Lead Model: 5076
Implantable Pulse Generator Implant Date: 20230829
Lead Channel Impedance Value: 342 Ohm
Lead Channel Impedance Value: 380 Ohm
Lead Channel Impedance Value: 456 Ohm
Lead Channel Impedance Value: 513 Ohm
Lead Channel Pacing Threshold Amplitude: 0.5 V
Lead Channel Pacing Threshold Amplitude: 0.75 V
Lead Channel Pacing Threshold Pulse Width: 0.4 ms
Lead Channel Pacing Threshold Pulse Width: 0.4 ms
Lead Channel Sensing Intrinsic Amplitude: 1.625 mV
Lead Channel Sensing Intrinsic Amplitude: 1.625 mV
Lead Channel Sensing Intrinsic Amplitude: 16.5 mV
Lead Channel Sensing Intrinsic Amplitude: 16.5 mV
Lead Channel Setting Pacing Amplitude: 1.5 V
Lead Channel Setting Pacing Amplitude: 1.5 V
Lead Channel Setting Pacing Pulse Width: 0.4 ms
Lead Channel Setting Sensing Sensitivity: 0.9 mV
Zone Setting Status: 755011
Zone Setting Status: 755011

## 2022-08-13 NOTE — Progress Notes (Signed)
Electrophysiology Office Follow up Visit Note:    Date:  08/13/2022   ID:  Cesar Wilson, DOB 20-Jan-1951, MRN [id number]  PCP:  Lennie Odor, PA  [facility] Cardiologist:  None  [facility] Electrophysiologist:  Vickie Epley, MD    Interval History:    Cesar Wilson is a 71 y.o. male who presents for a follow up visit. He had a PPM implanted 05/01/2022 for CHB. Device interrogations have shown stable device function. On the most recent interrogation there was ~ 90 seconds of AF.   Today, he reports feeling much better than at his last visit. After completing an escorted lap around the office he denies experiencing shortness of breath. He has also returned to work with no exertional symptoms.  No pain or discomfort associated with his prepectoral pocket.  Recently he complains of a lingering sinus infection despite OTC treatment.  He denies any palpitations, chest pain, or peripheral edema. No lightheadedness, headaches, syncope, orthopnea, or PND.      Past Medical History:  Diagnosis Date   Anxiety    Biceps tendon rupture    left distal   GERD (gastroesophageal reflux disease)    Hammer toe of left foot    2nd and 3rd toes   Hypertension    under control; has been on med. x 23 yrs.    Past Surgical History:  Procedure Laterality Date   arm surgery Left    DISTAL BICEPS TENDON REPAIR  08/06/2011   Procedure: DISTAL BICEPS TENDON REPAIR;  Surgeon: Nita Sells, MD;  Location: [place];  Service: Orthopedics;  Laterality: Left;   HAMMER TOE SURGERY Right 11/18/2018   Procedure: RIGHT FOOT 2ND AND 3RD HAMMER TOE CORRECTION;  Surgeon: Erle Crocker, MD;  Location: [place];  Service: Orthopedics;  Laterality: Right;   HAMMERTOE RECONSTRUCTION WITH WEIL OSTEOTOMY Left 09/23/2018   Procedure: LEFT SECOND AND THIRD HAMMER TOE CORRECTION  WITH WEIL OSTEOTOMY LEFT SECOND;  Surgeon: Erle Crocker, MD;   Location: [place];  Service: Orthopedics;  Laterality: Left;   ORIF ANKLE FRACTURE  12/08/2009   left lat. malleolus   PACEMAKER IMPLANT N/A 05/01/2022   Procedure: PACEMAKER IMPLANT;  Surgeon: Vickie Epley, MD;  Location: [place] [facility];  Service: Cardiovascular;  Laterality: N/A;   TEMPORARY PACEMAKER N/A 04/29/2022   Procedure: TEMPORARY PACEMAKER;  Surgeon: Nigel Mormon, MD;  Location: [place] [facility];  Service: Cardiovascular;  Laterality: N/A;   TONSILLECTOMY AND ADENOIDECTOMY  as a child    Current Medications: Current Meds  Medication Sig   albuterol (VENTOLIN HFA) 108 (90 Base) MCG/ACT inhaler Inhale 2 puffs into the lungs every 4 (four) hours as needed for shortness of breath.   Ascorbic Acid (VITAMIN C) 1000 MG tablet Take 1,000 mg by mouth daily.   cholecalciferol (VITAMIN D) 1000 UNITS tablet Take 1,000 Units by mouth daily.   diltiazem (CARDIZEM LA) 240 MG 24 hr tablet Take 240 mg by mouth daily.   levothyroxine (SYNTHROID) 50 MCG tablet Take 50 mcg by mouth every morning.   Loratadine 10 MG CAPS Take by mouth.   losartan (COZAAR) 100 MG tablet Take 1 tablet by mouth in the evening   Multiple Vitamins-Minerals (MULTIVITAMINS THER. W/MINERALS) TABS Take 1 tablet by mouth daily.   Nutritional Supplements (PROSTATE PO) Take by mouth.   omeprazole (PRILOSEC) 20 MG capsule Take 20 mg by mouth daily.     Allergies:  Sulfa antibiotics, Zithromax [azithromycin], and Percocet [oxycodone-acetaminophen]   Social History   Socioeconomic History   Marital status: Married    Spouse name: Theatre manager   Number of children: 2   Years of education: College   Highest education level: Not on file  Occupational History   Occupation: [employer]  Tobacco Use   Smoking status: Former    Packs/day: 2.00    Years: 15.00    Total pack years: 30.00    Types: Cigarettes    Quit date: 1992    Years since quitting: 31.9   Smokeless tobacco:  Never   Tobacco comments:    quit smoking 20 yrs. ago  Vaping Use   Vaping Use: Never used  Substance and Sexual Activity   Alcohol use: No   Drug use: No   Sexual activity: Not on file  Other Topics Concern   Not on file  Social History Narrative   Right handed   Coffee daily   Lives with wife   Social Determinants of Health   Financial Resource Strain: Not on file  Food Insecurity: Not on file  Transportation Needs: Not on file  Physical Activity: Not on file  Stress: Not on file  Social Connections: Not on file     Family History: The patient's family history includes Dementia in his mother; Leukemia in his father; Parkinson's disease in his mother.  ROS:   Please see the history of present illness.    All other systems reviewed and are negative.  EKGs/Labs/Other Studies Reviewed:    The following studies were reviewed today:  08/12/2022 in clinic device interrogation personally reviewed Battery 13.1 years Lead parameters stable 98.5% VP 28.6% AP Chronotropic response with sensor excellent during walk around office 0 AF episodes    Recent Labs: 04/29/2022: ALT 21; TSH 2.674 05/02/2022: B Natriuretic Peptide 297.3; Hemoglobin 14.0; Magnesium 1.8; Platelets 141 06/27/2022: BUN 24; Creatinine, Ser 1.27; Potassium 4.3; Sodium 140   Recent Lipid Panel No results found for: "CHOL", "TRIG", "HDL", "CHOLHDL", "VLDL", "LDLCALC", "LDLDIRECT"  Physical Exam:    VS:  BP 128/82   Pulse 71   Ht 5\' 11"  (1.803 m)   Wt 203 lb 3.2 oz (92.2 kg)   SpO2 97%   BMI 28.34 kg/m     Wt Readings from Last 3 Encounters:  08/13/22 203 lb 3.2 oz (92.2 kg)  05/16/22 209 lb (94.8 kg)  05/01/22 199 lb 4.7 oz (90.4 kg)     GEN: Well nourished, well developed in no acute distress HEENT: Normal NECK: No JVD; No carotid bruits LYMPHATICS: No lymphadenopathy CARDIAC: RRR, no murmurs, rubs, gallops. Prepectoral pocket well healed. RESPIRATORY:  Clear to auscultation without  rales, wheezing or rhonchi  ABDOMEN: Soft, non-tender, non-distended MUSCULOSKELETAL:  No edema; No deformity  SKIN: Warm and dry NEUROLOGIC:  Alert and oriented x 3 PSYCHIATRIC:  Normal affect        ASSESSMENT:    1. Complete AV block ([place])   2. Primary hypertension   3. Cardiac pacemaker in situ    PLAN:    In order of problems listed above:   #CHB #PPM in situ Device functioning appropriately. Continue remote monitoring.  #Hypertension At goal today.  Recommend checking blood pressures 1-2 times per week at home and recording the values.  Recommend bringing these recordings to the primary care physician.   Follow-up in 1 year.  Medication Adjustments/Labs and Tests Ordered: Current medicines are reviewed at length with the patient today.  Concerns regarding medicines are outlined above.   No orders of the defined types were placed in this encounter.  No orders of the defined types were placed in this encounter.  I,Mathew Stumpf,acting as a Neurosurgeon for Lanier Prude, MD.,have documented all relevant documentation on the behalf of Lanier Prude, MD,as directed by  Lanier Prude, MD while in the presence of Lanier Prude, MD.  I, Lanier Prude, MD, have reviewed all documentation for this visit. The documentation on 08/13/22 for the exam, diagnosis, procedures, and orders are all accurate and complete.    Signed, Steffanie Dunn, MD, [facility] 08/13/2022 4:19 PM    Electrophysiology [facility]

## 2022-08-13 NOTE — Patient Instructions (Signed)
Medication Instructions:  Your physician recommends that you continue on your current medications as directed. Please refer to the Current Medication list given to you today.  *If you need a refill on your cardiac medications before your next appointment, please call your pharmacy*  Follow-Up: At [facility], you and your health needs are our priority.  As part of our continuing mission to provide you with exceptional heart care, we have created designated Provider Care Teams.  These Care Teams include your primary Cardiologist (physician) and Advanced Practice Providers (APPs -  Physician Assistants and Nurse Practitioners) who all work together to provide you with the care you need, when you need it.  Your next appointment:   1 year(s)  The format for your next appointment:   In Person  Provider:   You may see CAMERON T LAMBERT, MD or one of the following Advanced Practice Providers on your designated Care Team:   Renee Ursuy, PA-C Michael "Andy" Tillery, PA-C     Important Information About Sugar       

## 2022-08-16 ENCOUNTER — Encounter: Payer: Self-pay | Admitting: Cardiology

## 2022-08-16 ENCOUNTER — Ambulatory Visit: Payer: Medicare HMO | Admitting: Cardiology

## 2022-08-16 VITALS — BP 189/90 | HR 70 | Resp 17 | Ht 71.0 in | Wt 204.0 lb

## 2022-08-16 DIAGNOSIS — I442 Atrioventricular block, complete: Secondary | ICD-10-CM | POA: Diagnosis not present

## 2022-08-16 DIAGNOSIS — I10 Essential (primary) hypertension: Secondary | ICD-10-CM

## 2022-08-16 MED ORDER — AMLODIPINE BESYLATE 10 MG PO TABS: 10.0000 mg | ORAL_TABLET | Freq: Every day | ORAL | 3 refills | Status: DC

## 2022-08-16 NOTE — Progress Notes (Signed)
TOC visit  Subjective:   Cesar Wilson, male    DOB: 02/20/1951, 71 y.o.   MRN: [id number]    HPI  Chief Complaint  Patient presents with   Hypertension   Follow-up    3 month    71 y.o. Caucasian male  with hypertension, hypothyroidism, now s/p PPM for high grade AV block.   Patient is doing well, denies any symptoms. Blood pressure very high today, but was much lower at EP visit 3 days ago. Reviewed home blood pressure monitoring data, which does suggest mildly uncontrolled hypertension. Patient was to come off diltiazem if possible.      Current Outpatient Medications:    albuterol (VENTOLIN HFA) 108 (90 Base) MCG/ACT inhaler, Inhale 2 puffs into the lungs every 4 (four) hours as needed for shortness of breath., Disp: , Rfl:    Ascorbic Acid (VITAMIN C) 1000 MG tablet, Take 1,000 mg by mouth daily., Disp: , Rfl:    calcium carbonate (TUMS - DOSED IN MG ELEMENTAL CALCIUM) 500 MG chewable tablet, Chew 1 tablet by mouth daily., Disp: , Rfl:    cholecalciferol (VITAMIN D) 1000 UNITS tablet, Take 1,000 Units by mouth daily., Disp: , Rfl:    diltiazem (CARDIZEM LA) 240 MG 24 hr tablet, Take 240 mg by mouth daily., Disp: , Rfl:    levothyroxine (SYNTHROID) 50 MCG tablet, Take 50 mcg by mouth every morning., Disp: , Rfl:    Loratadine 10 MG CAPS, Take by mouth., Disp: , Rfl:    losartan (COZAAR) 100 MG tablet, Take 1 tablet by mouth in the evening, Disp: 30 tablet, Rfl: 0   Multiple Vitamins-Minerals (MULTIVITAMINS THER. W/MINERALS) TABS, Take 1 tablet by mouth daily., Disp: , Rfl:    Nutritional Supplements (PROSTATE PO), Take by mouth., Disp: , Rfl:    omeprazole (PRILOSEC) 20 MG capsule, Take 20 mg by mouth daily., Disp: , Rfl:    Cardiovascular & other pertient studies:  Reviewed external labs and tests, independently interpreted   EKG 05/02/2022: Sinus rhythm Ventricular pacing   Recent labs: 05/02/2022: Glucose 112, BUN/Cr 20/1.29. EGFR 59. Na/K 140/3.5.  BNP  297 H/H 14/40. MCV 87. Platelets 141 TSH 2.6 normal    Review of Systems  Cardiovascular:  Negative for chest pain, dyspnea on exertion, leg swelling, palpitations and syncope.         Vitals:   08/16/22 1314 08/16/22 1317  BP: (!) 190/96 (!) 189/90  Pulse: 66 70  Resp: 17   SpO2: 98% 98%    Body mass index is 28.45 kg/m. Filed Weights   08/16/22 1314  Weight: 204 lb (92.5 kg)     Objective:   Physical Exam Vitals and nursing note reviewed.  Constitutional:      General: He is not in acute distress. Neck:     Vascular: No JVD.  Cardiovascular:     Rate and Rhythm: Normal rate and regular rhythm.     Heart sounds: Normal heart sounds. No murmur heard. Pulmonary:     Effort: Pulmonary effort is normal.     Breath sounds: Normal breath sounds. No wheezing or rales.  Musculoskeletal:     Right lower leg: No edema.     Left lower leg: No edema.             Visit diagnoses:   ICD-10-CM   1. Primary hypertension  I10 amLODipine (NORVASC) 10 MG tablet    DISCONTINUED: amLODipine (NORVASC) 10 MG tablet    2. Complete AV  block ([place])  I44.2        Meds ordered this encounter  Medications   DISCONTD: amLODipine (NORVASC) 10 MG tablet    Sig: Take 1 tablet (10 mg total) by mouth daily.    Dispense:  30 tablet    Refill:  3   amLODipine (NORVASC) 10 MG tablet    Sig: Take 1 tablet (10 mg total) by mouth daily.    Dispense:  30 tablet    Refill:  3     Assessment & Recommendations:    71 y.o. Caucasian male  with hypertension, hypothyroidism, now s/p PPM for high grade AV block.  High grade AV block: Now s/p PPM.  Hypertension: Uncontrolled.   There is also likely component of whitecoat hypertension.  Change diltiazem 240 mg daily to amlodipine 10 mg daily.  Continue remote patient monitoring for hypertension management.   F/u w/me in 6 weeks      Nigel Mormon, MD Pager: [phone] Office: [phone]

## 2022-08-19 DIAGNOSIS — J0100 Acute maxillary sinusitis, unspecified: Secondary | ICD-10-CM | POA: Diagnosis not present

## 2022-08-30 ENCOUNTER — Other Ambulatory Visit: Payer: Self-pay | Admitting: Cardiology

## 2022-08-30 DIAGNOSIS — I10 Essential (primary) hypertension: Secondary | ICD-10-CM

## 2022-09-08 DIAGNOSIS — I10 Essential (primary) hypertension: Secondary | ICD-10-CM | POA: Diagnosis not present

## 2022-09-10 DIAGNOSIS — E039 Hypothyroidism, unspecified: Secondary | ICD-10-CM | POA: Diagnosis not present

## 2022-09-10 DIAGNOSIS — K219 Gastro-esophageal reflux disease without esophagitis: Secondary | ICD-10-CM | POA: Diagnosis not present

## 2022-09-10 DIAGNOSIS — E7800 Pure hypercholesterolemia, unspecified: Secondary | ICD-10-CM | POA: Diagnosis not present

## 2022-09-10 DIAGNOSIS — M549 Dorsalgia, unspecified: Secondary | ICD-10-CM | POA: Diagnosis not present

## 2022-09-10 DIAGNOSIS — Z1211 Encounter for screening for malignant neoplasm of colon: Secondary | ICD-10-CM | POA: Diagnosis not present

## 2022-09-10 DIAGNOSIS — Z950 Presence of cardiac pacemaker: Secondary | ICD-10-CM | POA: Diagnosis not present

## 2022-09-10 DIAGNOSIS — Z23 Encounter for immunization: Secondary | ICD-10-CM | POA: Diagnosis not present

## 2022-09-10 DIAGNOSIS — I442 Atrioventricular block, complete: Secondary | ICD-10-CM | POA: Diagnosis not present

## 2022-09-10 DIAGNOSIS — Z0000 Encounter for general adult medical examination without abnormal findings: Secondary | ICD-10-CM | POA: Diagnosis not present

## 2022-09-10 DIAGNOSIS — N1831 Chronic kidney disease, stage 3a: Secondary | ICD-10-CM | POA: Diagnosis not present

## 2022-09-10 DIAGNOSIS — I10 Essential (primary) hypertension: Secondary | ICD-10-CM | POA: Diagnosis not present

## 2022-09-10 DIAGNOSIS — N529 Male erectile dysfunction, unspecified: Secondary | ICD-10-CM | POA: Diagnosis not present

## 2022-09-13 DIAGNOSIS — R972 Elevated prostate specific antigen [PSA]: Secondary | ICD-10-CM | POA: Diagnosis not present

## 2022-09-14 NOTE — Progress Notes (Signed)
Remote pacemaker transmission.   

## 2022-09-19 ENCOUNTER — Other Ambulatory Visit: Payer: Self-pay | Admitting: Nurse Practitioner

## 2022-09-19 DIAGNOSIS — R972 Elevated prostate specific antigen [PSA]: Secondary | ICD-10-CM

## 2022-09-25 ENCOUNTER — Other Ambulatory Visit: Payer: Self-pay | Admitting: Cardiology

## 2022-09-25 DIAGNOSIS — I10 Essential (primary) hypertension: Secondary | ICD-10-CM

## 2022-09-26 NOTE — Progress Notes (Unsigned)
TOC visit  Subjective:   Cesar Wilson, male    DOB: 07-13-1951, 72 y.o.   MRN: [id number]    HPI  No chief complaint on file.   72 y.o. Caucasian male  with hypertension, hypothyroidism, now s/p PPM for high grade AV block.   Patient is doing well, denies any symptoms. Blood pressure very high today, but was much lower at EP visit 3 days ago. Reviewed home blood pressure monitoring data, which does suggest mildly uncontrolled hypertension. Patient was to come off diltiazem if possible.      Current Outpatient Medications:  .  albuterol (VENTOLIN HFA) 108 (90 Base) MCG/ACT inhaler, Inhale 2 puffs into the lungs every 4 (four) hours as needed for shortness of breath., Disp: , Rfl:  .  amLODipine (NORVASC) 10 MG tablet, Take 1 tablet (10 mg total) by mouth daily., Disp: 30 tablet, Rfl: 3 .  Ascorbic Acid (VITAMIN C) 1000 MG tablet, Take 1,000 mg by mouth daily., Disp: , Rfl:  .  calcium carbonate (TUMS - DOSED IN MG ELEMENTAL CALCIUM) 500 MG chewable tablet, Chew 1 tablet by mouth daily., Disp: , Rfl:  .  cholecalciferol (VITAMIN D) 1000 UNITS tablet, Take 1,000 Units by mouth daily., Disp: , Rfl:  .  levothyroxine (SYNTHROID) 50 MCG tablet, Take 50 mcg by mouth every morning., Disp: , Rfl:  .  Loratadine 10 MG CAPS, Take by mouth., Disp: , Rfl:  .  losartan (COZAAR) 100 MG tablet, Take 1 tablet by mouth in the evening, Disp: 30 tablet, Rfl: 0 .  Multiple Vitamins-Minerals (MULTIVITAMINS THER. W/MINERALS) TABS, Take 1 tablet by mouth daily., Disp: , Rfl:  .  Nutritional Supplements (PROSTATE PO), Take by mouth., Disp: , Rfl:  .  omeprazole (PRILOSEC) 20 MG capsule, Take 20 mg by mouth daily., Disp: , Rfl:    Cardiovascular & other pertient studies:  Reviewed external labs and tests, independently interpreted   EKG 05/02/2022: Sinus rhythm Ventricular pacing   Recent labs: 05/02/2022: Glucose 112, BUN/Cr 20/1.29. EGFR 59. Na/K 140/3.5.  BNP 297 H/H 14/40. MCV 87.  Platelets 141 TSH 2.6 normal    Review of Systems  Cardiovascular:  Negative for chest pain, dyspnea on exertion, leg swelling, palpitations and syncope.         There were no vitals filed for this visit.   There is no height or weight on file to calculate BMI. There were no vitals filed for this visit.    Objective:   Physical Exam Vitals and nursing note reviewed.  Constitutional:      General: He is not in acute distress. Neck:     Vascular: No JVD.  Cardiovascular:     Rate and Rhythm: Normal rate and regular rhythm.     Heart sounds: Normal heart sounds. No murmur heard. Pulmonary:     Effort: Pulmonary effort is normal.     Breath sounds: Normal breath sounds. No wheezing or rales.  Musculoskeletal:     Right lower leg: No edema.     Left lower leg: No edema.            Visit diagnoses: No diagnosis found.    No orders of the defined types were placed in this encounter.    Assessment & Recommendations:    72 y.o. Caucasian male  with hypertension, hypothyroidism, now s/p PPM for high grade AV block.  High grade AV block: Now s/p PPM.  Hypertension: Uncontrolled.   There is also likely component of  whitecoat hypertension.  Change diltiazem 240 mg daily to amlodipine 10 mg daily.  Continue remote patient monitoring for hypertension management.   F/u w/me in 6 weeks      Nigel Mormon, MD Pager: [phone] Office: [phone]

## 2022-09-27 ENCOUNTER — Encounter: Payer: Self-pay | Admitting: Cardiology

## 2022-09-27 ENCOUNTER — Ambulatory Visit: Payer: Medicare HMO | Admitting: Cardiology

## 2022-09-27 ENCOUNTER — Telehealth: Payer: Self-pay

## 2022-09-27 VITALS — BP 177/99 | HR 74 | Ht 71.0 in | Wt 206.7 lb

## 2022-09-27 DIAGNOSIS — I442 Atrioventricular block, complete: Secondary | ICD-10-CM | POA: Diagnosis not present

## 2022-09-27 DIAGNOSIS — I10 Essential (primary) hypertension: Secondary | ICD-10-CM

## 2022-09-27 NOTE — Telephone Encounter (Signed)
Patient home blood pressure readings are much better controlled on current antihypertensive regimen.    DATE TIME SYS DIA PUL 09/16/2022 8:16 PM 138 79 61 09/16/2022 12:52 PM 133 75 65 09/16/2022 12:52 PM 127 70 73 09/15/2022 9:26 PM 127 70 73 09/14/2022 8:32 PM 121 65 66 09/13/2022 8:44 PM 129 72 76 09/12/2022 9:09 PM 133 78 65 09/11/2022 8:04 PM 122 68 66 09/11/2022 1:15 PM 120 72 69 09/10/2022 9:12 PM 138 78 72 09/10/2022 7:22 AM 136 84 83 09/09/2022 7:25 PM 140 79 73 09/08/2022 8:15 PM 125 77 72 09/08/2022 9:01 AM 130 78 79 09/07/2022 9:21 PM 135 77 71 09/06/2022 9:16 PM 123 78 72 09/05/2022 9:07 PM 126 79 77 09/04/2022 8:03 PM 116 82 78

## 2022-10-09 DIAGNOSIS — I10 Essential (primary) hypertension: Secondary | ICD-10-CM | POA: Diagnosis not present

## 2022-10-23 ENCOUNTER — Telehealth: Payer: Self-pay

## 2022-10-23 NOTE — Telephone Encounter (Signed)
Patient home blood pressure remains controlled with amlodipine 10m daily and losartan 1024mqPM.  DATE TIME SYS DIA PUL 10/22/2022 4:39 PM 135 76 66 10/21/2022 7:48 PM 120 71 72 10/20/2022 10:07 PM 136 69 64 10/20/2022 12:32 PM 127 73 64 10/20/2022 12:31 PM 127 73 64 10/20/2022 8:18 AM 124 65 68 10/10/2022 9:12 PM 126 70 72 10/08/2022 4:29 PM 133 76 67 10/06/2022 8:49 PM 124 77 74 10/06/2022 9:08 AM 136 77 65 10/05/2022 9:02 PM 128 72 67 10/05/2022 10:25 AM 133 72 63 10/04/2022 9:17 PM 125 75 76 10/03/2022 8:28 PM 135 73 70 10/02/2022 9:33 PM 130 77 81

## 2022-11-02 ENCOUNTER — Other Ambulatory Visit: Payer: Self-pay | Admitting: Cardiology

## 2022-11-02 DIAGNOSIS — I10 Essential (primary) hypertension: Secondary | ICD-10-CM

## 2022-11-06 NOTE — Telephone Encounter (Signed)
Thanks MJP

## 2022-11-06 NOTE — Telephone Encounter (Signed)
Patient home blood pressure remains controlled with amlodipine '10mg'$  daily and losartan '100mg'$  qPM.  DATE                 TIME SYS DIA PUL 11/04/2022 8:11 PM 121 64 65 11/03/2022 9:11 PM 128 66 67 11/01/2022 9:58 PM 125 67 64 10/31/2022 9:51 PM 120 70 72 10/30/2022 9:06 PM 117 75 73 10/29/2022 10:02 PM 129 69 65 10/26/2022 10:49 PM 131 74 64 10/25/2022 9:45 PM 127 68 65 10/25/2022 10:34 AM 129 76 69 10/24/2022 9:32 PM 130 71 69 10/24/2022 10:57 AM 128 72 64 10/23/2022 8:42 PM 136 75 71

## 2022-11-12 ENCOUNTER — Ambulatory Visit: Payer: Medicare HMO

## 2022-11-12 DIAGNOSIS — I442 Atrioventricular block, complete: Secondary | ICD-10-CM | POA: Diagnosis not present

## 2022-11-13 LAB — CUP PACEART REMOTE DEVICE CHECK
Battery Remaining Longevity: 157 mo
Battery Voltage: 3.16 V
Brady Statistic AP VP Percent: 18.21 %
Brady Statistic AP VS Percent: 0.98 %
Brady Statistic AS VP Percent: 75.83 %
Brady Statistic AS VS Percent: 4.98 %
Brady Statistic RA Percent Paced: 19.2 %
Brady Statistic RV Percent Paced: 94.04 %
Date Time Interrogation Session: 20240310231029
Implantable Lead Connection Status: 753985
Implantable Lead Connection Status: 753985
Implantable Lead Implant Date: 20230829
Implantable Lead Implant Date: 20230829
Implantable Lead Location: 753859
Implantable Lead Location: 753860
Implantable Lead Model: 3830
Implantable Lead Model: 5076
Implantable Pulse Generator Implant Date: 20230829
Lead Channel Impedance Value: 361 Ohm
Lead Channel Impedance Value: 380 Ohm
Lead Channel Impedance Value: 456 Ohm
Lead Channel Impedance Value: 570 Ohm
Lead Channel Pacing Threshold Amplitude: 0.5 V
Lead Channel Pacing Threshold Amplitude: 0.875 V
Lead Channel Pacing Threshold Pulse Width: 0.4 ms
Lead Channel Pacing Threshold Pulse Width: 0.4 ms
Lead Channel Sensing Intrinsic Amplitude: 19.875 mV
Lead Channel Sensing Intrinsic Amplitude: 19.875 mV
Lead Channel Sensing Intrinsic Amplitude: 2 mV
Lead Channel Sensing Intrinsic Amplitude: 2 mV
Lead Channel Setting Pacing Amplitude: 1.5 V
Lead Channel Setting Pacing Amplitude: 1.5 V
Lead Channel Setting Pacing Pulse Width: 0.4 ms
Lead Channel Setting Sensing Sensitivity: 0.9 mV
Zone Setting Status: 755011
Zone Setting Status: 755011

## 2022-11-26 ENCOUNTER — Ambulatory Visit (HOSPITAL_COMMUNITY)
Admission: RE | Admit: 2022-11-26 | Discharge: 2022-11-26 | Disposition: A | Discharge status: Home / Self Care | Payer: Medicare HMO | Source: Ambulatory Visit | Attending: Nurse Practitioner | Admitting: Nurse Practitioner

## 2022-11-26 DIAGNOSIS — N400 Enlarged prostate without lower urinary tract symptoms: Secondary | ICD-10-CM | POA: Diagnosis not present

## 2022-11-26 DIAGNOSIS — R972 Elevated prostate specific antigen [PSA]: Secondary | ICD-10-CM

## 2022-11-26 MED ORDER — GADOBUTROL 1 MMOL/ML IV SOLN
9.0000 mL | Freq: Once | INTRAVENOUS | Status: AC | PRN
  Administered 2022-11-26: 9 mL via INTRAVENOUS

## 2022-11-26 NOTE — Progress Notes (Signed)
Per order,  Changed device settings to DOO 90 bpm MRI sure scan.  Tachy therapies off if applicable.  Will program device back to regular settings after MRI and send post transmission.

## 2022-12-07 DIAGNOSIS — K648 Other hemorrhoids: Secondary | ICD-10-CM | POA: Diagnosis not present

## 2022-12-07 DIAGNOSIS — D123 Benign neoplasm of transverse colon: Secondary | ICD-10-CM | POA: Diagnosis not present

## 2022-12-07 DIAGNOSIS — K5730 Diverticulosis of large intestine without perforation or abscess without bleeding: Secondary | ICD-10-CM | POA: Diagnosis not present

## 2022-12-07 DIAGNOSIS — Z1211 Encounter for screening for malignant neoplasm of colon: Secondary | ICD-10-CM | POA: Diagnosis not present

## 2022-12-07 DIAGNOSIS — D120 Benign neoplasm of cecum: Secondary | ICD-10-CM | POA: Diagnosis not present

## 2022-12-11 DIAGNOSIS — D123 Benign neoplasm of transverse colon: Secondary | ICD-10-CM | POA: Diagnosis not present

## 2022-12-11 DIAGNOSIS — D120 Benign neoplasm of cecum: Secondary | ICD-10-CM | POA: Diagnosis not present

## 2022-12-18 ENCOUNTER — Telehealth: Payer: Self-pay

## 2022-12-18 NOTE — Telephone Encounter (Signed)
Patient's home blood pressure remains controlled on current antihypertensive regimen.  DATE  TIME  SYS DIA PUL 12/17/2022 9:55 AM 139 79 60 12/11/2022 9:07 PM 127 72 66 12/10/2022 9:56 PM 127 66 82 12/09/2022 5:41 PM 129 74 86 12/07/2022 6:07 AM 129 74 86 12/07/2022 4:14 AM 125 76 75 12/07/2022 4:05 AM 134 78 71 12/07/2022 3:52 AM 133 86 81 11/26/2022 11:23 AM 129 74 79 11/25/2022 8:26 PM 136 73 63 11/25/2022 5:26 PM 138 74 72 11/19/2022 9:10 PM 131 74 69 11/18/2022 8:17 PM 132 72 70 11/10/2022 9:11 PM 127 76 71 11/08/2022 9:57 PM 125 76 70 11/06/2022 9:14 PM 122 64 70 11/04/2022 8:11 PM 121 64 65 11/03/2022 9:11 PM 128 66 67

## 2022-12-18 NOTE — Progress Notes (Signed)
Remote pacemaker transmission.   

## 2022-12-18 NOTE — Telephone Encounter (Signed)
Excellent  Thanks MJP

## 2022-12-19 ENCOUNTER — Other Ambulatory Visit: Payer: Self-pay

## 2022-12-19 DIAGNOSIS — I10 Essential (primary) hypertension: Secondary | ICD-10-CM

## 2022-12-19 MED ORDER — LOSARTAN POTASSIUM 100 MG PO TABS: 100.0000 mg | ORAL_TABLET | Freq: Every evening | ORAL | 3 refills | Status: DC

## 2022-12-25 DIAGNOSIS — Z1211 Encounter for screening for malignant neoplasm of colon: Secondary | ICD-10-CM | POA: Diagnosis not present

## 2022-12-25 DIAGNOSIS — Z1212 Encounter for screening for malignant neoplasm of rectum: Secondary | ICD-10-CM | POA: Diagnosis not present

## 2022-12-27 DIAGNOSIS — N400 Enlarged prostate without lower urinary tract symptoms: Secondary | ICD-10-CM | POA: Diagnosis not present

## 2022-12-27 DIAGNOSIS — R972 Elevated prostate specific antigen [PSA]: Secondary | ICD-10-CM | POA: Diagnosis not present

## 2023-01-03 DIAGNOSIS — M7062 Trochanteric bursitis, left hip: Secondary | ICD-10-CM | POA: Diagnosis not present

## 2023-01-03 DIAGNOSIS — M7061 Trochanteric bursitis, right hip: Secondary | ICD-10-CM | POA: Diagnosis not present

## 2023-02-11 ENCOUNTER — Ambulatory Visit (INDEPENDENT_AMBULATORY_CARE_PROVIDER_SITE_OTHER): Payer: Medicare HMO

## 2023-02-11 DIAGNOSIS — I442 Atrioventricular block, complete: Secondary | ICD-10-CM

## 2023-02-11 LAB — CUP PACEART REMOTE DEVICE CHECK
Battery Remaining Longevity: 153 mo
Battery Voltage: 3.12 V
Brady Statistic AP VP Percent: 22.04 %
Brady Statistic AP VS Percent: 1.12 %
Brady Statistic AS VP Percent: 68.93 %
Brady Statistic AS VS Percent: 7.92 %
Brady Statistic RA Percent Paced: 23.08 %
Brady Statistic RV Percent Paced: 90.97 %
Date Time Interrogation Session: 20240610042348
Implantable Lead Connection Status: 753985
Implantable Lead Connection Status: 753985
Implantable Lead Implant Date: 20230829
Implantable Lead Implant Date: 20230829
Implantable Lead Location: 753859
Implantable Lead Location: 753860
Implantable Lead Model: 3830
Implantable Lead Model: 5076
Implantable Pulse Generator Implant Date: 20230829
Lead Channel Impedance Value: 361 Ohm
Lead Channel Impedance Value: 380 Ohm
Lead Channel Impedance Value: 437 Ohm
Lead Channel Impedance Value: 532 Ohm
Lead Channel Pacing Threshold Amplitude: 0.5 V
Lead Channel Pacing Threshold Amplitude: 0.75 V
Lead Channel Pacing Threshold Pulse Width: 0.4 ms
Lead Channel Pacing Threshold Pulse Width: 0.4 ms
Lead Channel Sensing Intrinsic Amplitude: 1.75 mV
Lead Channel Sensing Intrinsic Amplitude: 1.75 mV
Lead Channel Sensing Intrinsic Amplitude: 19.75 mV
Lead Channel Sensing Intrinsic Amplitude: 19.75 mV
Lead Channel Setting Pacing Amplitude: 1.5 V
Lead Channel Setting Pacing Amplitude: 1.5 V
Lead Channel Setting Pacing Pulse Width: 0.4 ms
Lead Channel Setting Sensing Sensitivity: 0.9 mV
Zone Setting Status: 755011
Zone Setting Status: 755011

## 2023-03-04 NOTE — Progress Notes (Signed)
Remote pacemaker transmission.   

## 2023-03-28 ENCOUNTER — Ambulatory Visit: Payer: Medicare HMO | Admitting: Cardiology

## 2023-04-18 ENCOUNTER — Ambulatory Visit: Payer: Medicare HMO | Admitting: Cardiology

## 2023-04-19 ENCOUNTER — Other Ambulatory Visit: Payer: Self-pay | Admitting: Cardiology

## 2023-04-19 DIAGNOSIS — I10 Essential (primary) hypertension: Secondary | ICD-10-CM

## 2023-04-25 ENCOUNTER — Ambulatory Visit: Payer: Medicare HMO | Admitting: Cardiology

## 2023-05-02 DIAGNOSIS — U071 COVID-19: Secondary | ICD-10-CM | POA: Diagnosis not present

## 2023-05-07 ENCOUNTER — Telehealth: Payer: Self-pay

## 2023-05-07 NOTE — Telephone Encounter (Signed)
Spoke with patient informed him that I had reviewed his transmission and that his wires were working correctly, also informed him that taking OTC delsym should be fine just to monitor his blood pressure.

## 2023-05-07 NOTE — Telephone Encounter (Signed)
The pt recently had covid and wanted to make sure his ppm is okay. He is also asking if delsym. I let him speak with Ricard Dillon.

## 2023-05-13 ENCOUNTER — Ambulatory Visit (INDEPENDENT_AMBULATORY_CARE_PROVIDER_SITE_OTHER): Payer: Medicare HMO

## 2023-05-13 DIAGNOSIS — I442 Atrioventricular block, complete: Secondary | ICD-10-CM

## 2023-05-13 LAB — CUP PACEART REMOTE DEVICE CHECK
Battery Remaining Longevity: 150 mo
Battery Voltage: 3.08 V
Brady Statistic AP VP Percent: 34.73 %
Brady Statistic AP VS Percent: 1.23 %
Brady Statistic AS VP Percent: 54.97 %
Brady Statistic AS VS Percent: 9.08 %
Brady Statistic RA Percent Paced: 35.78 %
Brady Statistic RV Percent Paced: 89.7 %
Date Time Interrogation Session: 20240909050441
Implantable Lead Connection Status: 753985
Implantable Lead Connection Status: 753985
Implantable Lead Implant Date: 20230829
Implantable Lead Implant Date: 20230829
Implantable Lead Location: 753859
Implantable Lead Location: 753860
Implantable Lead Model: 3830
Implantable Lead Model: 5076
Implantable Pulse Generator Implant Date: 20230829
Lead Channel Impedance Value: 361 Ohm
Lead Channel Impedance Value: 380 Ohm
Lead Channel Impedance Value: 494 Ohm
Lead Channel Impedance Value: 570 Ohm
Lead Channel Pacing Threshold Amplitude: 0.625 V
Lead Channel Pacing Threshold Amplitude: 0.75 V
Lead Channel Pacing Threshold Pulse Width: 0.4 ms
Lead Channel Pacing Threshold Pulse Width: 0.4 ms
Lead Channel Sensing Intrinsic Amplitude: 1.375 mV
Lead Channel Sensing Intrinsic Amplitude: 1.375 mV
Lead Channel Sensing Intrinsic Amplitude: 18.75 mV
Lead Channel Sensing Intrinsic Amplitude: 18.75 mV
Lead Channel Setting Pacing Amplitude: 1.5 V
Lead Channel Setting Pacing Amplitude: 1.5 V
Lead Channel Setting Pacing Pulse Width: 0.4 ms
Lead Channel Setting Sensing Sensitivity: 0.9 mV
Zone Setting Status: 755011
Zone Setting Status: 755011

## 2023-05-23 NOTE — Progress Notes (Signed)
Remote pacemaker transmission.   

## 2023-05-30 ENCOUNTER — Ambulatory Visit: Payer: Medicare HMO | Admitting: Cardiology

## 2023-06-01 ENCOUNTER — Other Ambulatory Visit: Payer: Self-pay | Admitting: Cardiology

## 2023-06-01 DIAGNOSIS — I10 Essential (primary) hypertension: Secondary | ICD-10-CM

## 2023-06-07 ENCOUNTER — Other Ambulatory Visit: Payer: Self-pay

## 2023-06-07 DIAGNOSIS — I10 Essential (primary) hypertension: Secondary | ICD-10-CM

## 2023-06-07 MED ORDER — LOSARTAN POTASSIUM 100 MG PO TABS: 100.0000 mg | ORAL_TABLET | Freq: Every evening | ORAL | 3 refills | Status: DC

## 2023-06-11 NOTE — Progress Notes (Unsigned)
Cardiology Office Note    Patient Name: Cesar Wilson Date of Encounter: 06/11/2023  Primary Care Provider:  Milus Height, PA Primary Cardiologist:  None Primary Electrophysiologist: Lanier Prude, MD   Past Medical History    Past Medical History:  Diagnosis Date   Anxiety    Biceps tendon rupture    left distal   GERD (gastroesophageal reflux disease)    Hammer toe of left foot    2nd and 3rd toes   Hypertension    under control; has been on med. x 23 yrs.    History of Present Illness  Cesar Wilson is a 72 y.o. male with a PMH of CHB s/p Medtronic PPM 04/2022, HTN, GERD, hypothyroidism, anxiety who presents today for 64-month follow-up.  Mr. Bucklew was seen initially in 04/2022 in the ED with complaint of exertional dyspnea and lightheadedness.  EKG was completed showing 2:1 AVB and patient was admitted and had temp wire placed.  Patient was on Cardizem and metoprolol which were both held with no resolution and PPM was placed by Dr. Lalla Brothers on 05/01/2022.  Patient was started back on Cardizem and discharged following resolution of AKI.  Patient was seen in follow-up and BP was elevated with Cardizem switched to amlodipine.  He was last seen on 09/27/2022 and patient's home blood pressures were well-controlled BP in the office was elevated.  It was determined that patient has some element of whitecoat hypertension.  During today's visit the patient reports*** .  Patient denies chest pain, palpitations, dyspnea, PND, orthopnea, nausea, vomiting, dizziness, syncope, edema, weight gain, or early satiety.  ***Notes: -Last ischemic evaluation: -Last echo: -Interim ED visits: Review of Systems  Please see the history of present illness.    All other systems reviewed and are otherwise negative except as noted above.  Physical Exam    Wt Readings from Last 3 Encounters:  09/27/22 206 lb 11.2 oz (93.8 kg)  08/16/22 204 lb (92.5 kg)  08/13/22 203 lb 3.2 oz (92.2 kg)    WU:XLKGM were no vitals filed for this visit.,There is no height or weight on file to calculate BMI. GEN: Well nourished, well developed in no acute distress Neck: No JVD; No carotid bruits Pulmonary: Clear to auscultation without rales, wheezing or rhonchi  Cardiovascular: Normal rate. Regular rhythm. Normal S1. Normal S2.   Murmurs: There is no murmur.  ABDOMEN: Soft, non-tender, non-distended EXTREMITIES:  No edema; No deformity   EKG/LABS/ Recent Cardiac Studies   ECG personally reviewed by me today - ***  Risk Assessment/Calculations:   {Does this patient have ATRIAL FIBRILLATION?[phone]}      Lab Results  Component Value Date   WBC 7.7 05/02/2022   HGB 14.0 05/02/2022   HCT 40.8 05/02/2022   MCV 87.6 05/02/2022   PLT 141 (L) 05/02/2022   Lab Results  Component Value Date   CREATININE 1.27 06/27/2022   BUN 24 06/27/2022   NA 140 06/27/2022   K 4.3 06/27/2022   CL 102 06/27/2022   CO2 23 06/27/2022   No results found for: "CHOL", "HDL", "LDLCALC", "LDLDIRECT", "TRIG", "CHOLHDL"  No results found for: "HGBA1C" Assessment & Plan    1.  Essential hypertension: -Patient's blood pressure today was***  2.  History of CHB: -s/p Medtronic PPM placed 04/2022 and managed by Dr. Lalla Brothers  3.  History of PPM: -PPM in situ as noted above with normal Paceart report as of 05/2023  4.***      Disposition: Follow-up with None  or APP in *** months {Are you ordering a CV Procedure (e.g. stress test, cath, DCCV, TEE, etc)?   Press F2        :[id number]}   Signed, Napoleon Form, Leodis Rains, NP 06/11/2023, 10:11 AM [facility]

## 2023-06-13 ENCOUNTER — Encounter: Payer: Self-pay | Admitting: Nurse Practitioner

## 2023-06-13 ENCOUNTER — Ambulatory Visit: Payer: Medicare HMO | Attending: Cardiology | Admitting: Nurse Practitioner

## 2023-06-13 VITALS — BP 142/82 | HR 70 | Ht 71.0 in | Wt 206.0 lb

## 2023-06-13 DIAGNOSIS — Z950 Presence of cardiac pacemaker: Secondary | ICD-10-CM

## 2023-06-13 DIAGNOSIS — I340 Nonrheumatic mitral (valve) insufficiency: Secondary | ICD-10-CM

## 2023-06-13 DIAGNOSIS — I442 Atrioventricular block, complete: Secondary | ICD-10-CM

## 2023-06-13 DIAGNOSIS — I10 Essential (primary) hypertension: Secondary | ICD-10-CM

## 2023-06-13 NOTE — Patient Instructions (Signed)
Medication Instructions:  Your physician recommends that you continue on your current medications as directed. Please refer to the Current Medication list given to you today. *If you need a refill on your cardiac medications before your next appointment, please call your pharmacy*   Lab Work: None Ordered   Testing/Procedures: Your physician has requested that you have an echocardiogram. Echocardiography is a painless test that uses sound waves to create images of your heart. It provides your doctor with information about the size and shape of your heart and how well your heart's chambers and valves are working. This procedure takes approximately one hour. There are no restrictions for this procedure. Please do NOT wear cologne, perfume, aftershave, or lotions (deodorant is allowed). Please arrive 15 minutes prior to your appointment time.    Follow-Up: At [facility], you and your health needs are our priority.  As part of our continuing mission to provide you with exceptional heart care, we have created designated Provider Care Teams.  These Care Teams include your primary Cardiologist (physician) and Advanced Practice Providers (APPs -  Physician Assistants and Nurse Practitioners) who all work together to provide you with the care you need, when you need it.  We recommend signing up for the patient portal called "MyChart".  Sign up information is provided on this After Visit Summary.  MyChart is used to connect with patients for Virtual Visits (Telemedicine).  Patients are able to view lab/test results, encounter notes, upcoming appointments, etc.  Non-urgent messages can be sent to your provider as well.   To learn more about what you can do with MyChart, go to [email or web address].    Your next appointment:   6 month(s)  Provider:   Truett Mainland, MD Other Instructions

## 2023-06-15 ENCOUNTER — Other Ambulatory Visit: Payer: Self-pay | Admitting: Cardiology

## 2023-06-15 DIAGNOSIS — I10 Essential (primary) hypertension: Secondary | ICD-10-CM

## 2023-08-12 ENCOUNTER — Ambulatory Visit (INDEPENDENT_AMBULATORY_CARE_PROVIDER_SITE_OTHER): Payer: Medicare HMO

## 2023-08-12 DIAGNOSIS — I442 Atrioventricular block, complete: Secondary | ICD-10-CM

## 2023-08-12 LAB — CUP PACEART REMOTE DEVICE CHECK
Battery Remaining Longevity: 146 mo
Battery Voltage: 3.06 V
Brady Statistic AP VP Percent: 26.58 %
Brady Statistic AP VS Percent: 0.96 %
Brady Statistic AS VP Percent: 62.74 %
Brady Statistic AS VS Percent: 9.72 %
Brady Statistic RA Percent Paced: 27.46 %
Brady Statistic RV Percent Paced: 89.32 %
Date Time Interrogation Session: 20241208214806
Implantable Lead Connection Status: 753985
Implantable Lead Connection Status: 753985
Implantable Lead Implant Date: 20230829
Implantable Lead Implant Date: 20230829
Implantable Lead Location: 753859
Implantable Lead Location: 753860
Implantable Lead Model: 3830
Implantable Lead Model: 5076
Implantable Pulse Generator Implant Date: 20230829
Lead Channel Impedance Value: 380 Ohm
Lead Channel Impedance Value: 399 Ohm
Lead Channel Impedance Value: 494 Ohm
Lead Channel Impedance Value: 494 Ohm
Lead Channel Pacing Threshold Amplitude: 0.5 V
Lead Channel Pacing Threshold Amplitude: 0.75 V
Lead Channel Pacing Threshold Pulse Width: 0.4 ms
Lead Channel Pacing Threshold Pulse Width: 0.4 ms
Lead Channel Sensing Intrinsic Amplitude: 1.625 mV
Lead Channel Sensing Intrinsic Amplitude: 1.625 mV
Lead Channel Sensing Intrinsic Amplitude: 13.25 mV
Lead Channel Sensing Intrinsic Amplitude: 13.25 mV
Lead Channel Setting Pacing Amplitude: 1.5 V
Lead Channel Setting Pacing Amplitude: 1.5 V
Lead Channel Setting Pacing Pulse Width: 0.4 ms
Lead Channel Setting Sensing Sensitivity: 0.9 mV
Zone Setting Status: 755011
Zone Setting Status: 755011

## 2023-09-12 ENCOUNTER — Ambulatory Visit (HOSPITAL_COMMUNITY): Payer: Medicare HMO | Attending: Cardiology

## 2023-09-12 DIAGNOSIS — I442 Atrioventricular block, complete: Secondary | ICD-10-CM | POA: Diagnosis not present

## 2023-09-12 DIAGNOSIS — Z950 Presence of cardiac pacemaker: Secondary | ICD-10-CM | POA: Diagnosis not present

## 2023-09-12 DIAGNOSIS — I10 Essential (primary) hypertension: Secondary | ICD-10-CM | POA: Diagnosis not present

## 2023-09-13 LAB — ECHOCARDIOGRAM COMPLETE
AR max vel: 1.98 cm2
AV Area VTI: 2.32 cm2
AV Area mean vel: 1.86 cm2
AV Mean grad: 19 mm[Hg]
AV Peak grad: 31.4 mm[Hg]
Ao pk vel: 2.8 m/s
Area-P 1/2: 2.95 cm2
S' Lateral: 2.7 cm

## 2023-10-25 ENCOUNTER — Other Ambulatory Visit: Payer: Self-pay | Admitting: Cardiology

## 2023-10-25 DIAGNOSIS — I10 Essential (primary) hypertension: Secondary | ICD-10-CM

## 2023-11-11 ENCOUNTER — Ambulatory Visit (INDEPENDENT_AMBULATORY_CARE_PROVIDER_SITE_OTHER): Payer: Medicare HMO

## 2023-11-11 DIAGNOSIS — I442 Atrioventricular block, complete: Secondary | ICD-10-CM

## 2023-11-12 LAB — CUP PACEART REMOTE DEVICE CHECK
Battery Remaining Longevity: 143 mo
Battery Voltage: 3.04 V
Brady Statistic AP VP Percent: 38.63 %
Brady Statistic AP VS Percent: 1.67 %
Brady Statistic AS VP Percent: 51.79 %
Brady Statistic AS VS Percent: 7.91 %
Brady Statistic RA Percent Paced: 40.14 %
Brady Statistic RV Percent Paced: 90.42 %
Date Time Interrogation Session: 20250309221222
Implantable Lead Connection Status: 753985
Implantable Lead Connection Status: 753985
Implantable Lead Implant Date: 20230829
Implantable Lead Implant Date: 20230829
Implantable Lead Location: 753859
Implantable Lead Location: 753860
Implantable Lead Model: 3830
Implantable Lead Model: 5076
Implantable Pulse Generator Implant Date: 20230829
Lead Channel Impedance Value: 361 Ohm
Lead Channel Impedance Value: 361 Ohm
Lead Channel Impedance Value: 475 Ohm
Lead Channel Impedance Value: 494 Ohm
Lead Channel Pacing Threshold Amplitude: 0.625 V
Lead Channel Pacing Threshold Amplitude: 0.75 V
Lead Channel Pacing Threshold Pulse Width: 0.4 ms
Lead Channel Pacing Threshold Pulse Width: 0.4 ms
Lead Channel Sensing Intrinsic Amplitude: 1.625 mV
Lead Channel Sensing Intrinsic Amplitude: 1.625 mV
Lead Channel Sensing Intrinsic Amplitude: 18.375 mV
Lead Channel Sensing Intrinsic Amplitude: 18.375 mV
Lead Channel Setting Pacing Amplitude: 1.5 V
Lead Channel Setting Pacing Amplitude: 1.5 V
Lead Channel Setting Pacing Pulse Width: 0.4 ms
Lead Channel Setting Sensing Sensitivity: 0.9 mV
Zone Setting Status: 755011
Zone Setting Status: 755011

## 2023-11-14 ENCOUNTER — Encounter: Payer: Self-pay | Admitting: Cardiology

## 2023-12-11 NOTE — Addendum Note (Signed)
 Addended by: Geralyn Flash D on: 12/11/2023 05:00 PM   Modules accepted: Orders

## 2023-12-11 NOTE — Progress Notes (Signed)
 Remote pacemaker transmission.

## 2024-01-09 ENCOUNTER — Ambulatory Visit: Payer: Medicare HMO | Attending: Cardiology | Admitting: Cardiology

## 2024-01-09 ENCOUNTER — Encounter: Payer: Self-pay | Admitting: Cardiology

## 2024-01-09 VITALS — BP 154/82 | HR 86 | Resp 16 | Ht 71.0 in | Wt 204.4 lb

## 2024-01-09 DIAGNOSIS — E782 Mixed hyperlipidemia: Secondary | ICD-10-CM | POA: Diagnosis not present

## 2024-01-09 DIAGNOSIS — I10 Essential (primary) hypertension: Secondary | ICD-10-CM

## 2024-01-09 DIAGNOSIS — I442 Atrioventricular block, complete: Secondary | ICD-10-CM

## 2024-01-09 MED ORDER — METOPROLOL SUCCINATE ER 50 MG PO TB24: 50.0000 mg | ORAL_TABLET | Freq: Every day | ORAL | 3 refills | Status: AC

## 2024-01-09 NOTE — Progress Notes (Signed)
  Cardiology Office Note:  .   Date:  01/09/2024  ID:  Cesar Wilson, DOB [birth year], MRN [id number] PCP: Diamond Formica, PA  [facility] Providers Cardiologist:  Fransico Ivy, MD PCP: Diamond Formica, [place]  Chief Complaint  Patient presents with   Follow-up    6 months   Hypertension        AV block     Cesar Wilson is a 73 y.o. male with hypertension, hypothyroidism, now s/p PPM for high grade AV block.   Patient is doing well.  Denies any complaints of chest pain, shortness of breath.  Blood pressure reasonably well-controlled at home but he stopped taking it recently.  Blood pressure is elevated today.  Reviewed pacemaker monitoring from March 2025, which showed occasional episodes of NSVT.   Vitals:   01/09/24 0811  BP: (!) 154/82  Pulse: 86  Resp: 16  SpO2: 94%      Review of Systems  Cardiovascular:  Negative for chest pain, dyspnea on exertion, leg swelling, palpitations and syncope.        Studies Reviewed: Cesar Wilson        EKG 06/13/2023: AV paced rhythm Frequent PAC's  Pacemaker interrogation 11/2023: Device interrogation reviewed. Battery and lead parameters stable. 3 NSVT, 1 nonsustained SVT. Device programming is appropriate. Continue remote monitoring.   Labs 09/2022: Chol 171, TG 125, HDL 42, LDL 106 HbA1C 5.4% Hb 14 Cr 1.27 TSH 2.6   Physical Exam Vitals and nursing note reviewed.  Constitutional:      General: He is not in acute distress. Neck:     Vascular: No JVD.  Cardiovascular:     Rate and Rhythm: Normal rate and regular rhythm.     Heart sounds: Normal heart sounds. No murmur heard. Pulmonary:     Effort: Pulmonary effort is normal.     Breath sounds: Normal breath sounds. No wheezing or rales.  Musculoskeletal:     Right lower leg: No edema.     Left lower leg: No edema.      VISIT DIAGNOSES:   ICD-10-CM   1. Primary hypertension  I10 Basic metabolic panel with GFR    CBC    2. Complete AV block (HCC)   I44.2     3. Mixed hyperlipidemia  E78.2 Lipid panel       Cesar Wilson is a 73 y.o. male with hypertension, hypothyroidism, now s/p PPM for high grade AV block.   Assessment & Plan  High grade AV block: Now s/p PPM.  Pacemaker functioning well.  Occasional episodes of NSVT, PACs noted on last EKG. Started metoprolol succinate 50 mg daily.  This should also help with blood pressure control.   Hypertension: Continue losartan  100 mg daily, amlodipine  10 mg daily. Added metoprolol succinate 50 mg daily as above.  Given that he has not had any recent lab work, I last cholesterol was elevated in 2024, I will check CBC, BMP, and lipid panel.  Will touch base based on results of these tests.       No orders of the defined types were placed in this encounter.    F/u in 1 year  Signed, Cody Das, MD

## 2024-01-09 NOTE — Patient Instructions (Signed)
 Medication Instructions:  START Metoprolol Succinate 50 mg take one tablet by mouth daily   *If you need a refill on your cardiac medications before your next appointment, please call your pharmacy*  Lab Work: LIPID PANEL  BMP CBC  If you have labs (blood work) drawn today and your tests are completely normal, you will receive your results only by: MyChart Message (if you have MyChart) OR A paper copy in the mail If you have any lab test that is abnormal or we need to change your treatment, we will call you to review the results.   Follow-Up: At [facility], you and your health needs are our priority.  As part of our continuing mission to provide you with exceptional heart care, our providers are all part of one team.  This team includes your primary Cardiologist (physician) and Advanced Practice Providers or APPs (Physician Assistants and Nurse Practitioners) who all work together to provide you with the care you need, when you need it.  Your next appointment:   1 year(s)  Provider:   Cody Das, MD

## 2024-01-10 LAB — CBC
Hematocrit: 43.9 % (ref 37.5–51.0)
Hemoglobin: 14.6 g/dL (ref 13.0–17.7)
MCH: 29.8 pg (ref 26.6–33.0)
MCHC: 33.3 g/dL (ref 31.5–35.7)
MCV: 90 fL (ref 79–97)
Platelets: 190 10*3/uL (ref 150–450)
RBC: 4.9 x10E6/uL (ref 4.14–5.80)
RDW: 13.2 % (ref 11.6–15.4)
WBC: 6.7 10*3/uL (ref 3.4–10.8)

## 2024-01-10 LAB — LIPID PANEL
Chol/HDL Ratio: 4.2 ratio (ref 0.0–5.0)
Cholesterol, Total: 163 mg/dL (ref 100–199)
HDL: 39 mg/dL — ABNORMAL LOW (ref >39)
LDL Chol Calc (NIH): 108 mg/dL — ABNORMAL HIGH (ref 0–99)
Triglycerides: 83 mg/dL (ref 0–149)
VLDL Cholesterol Cal: 16 mg/dL (ref 5–40)

## 2024-01-10 LAB — BASIC METABOLIC PANEL WITH GFR
BUN/Creatinine Ratio: 21 (ref 10–24)
BUN: 30 mg/dL — ABNORMAL HIGH (ref 8–27)
CO2: 20 mmol/L (ref 20–29)
Calcium: 9.4 mg/dL (ref 8.6–10.2)
Chloride: 105 mmol/L (ref 96–106)
Creatinine, Ser: 1.46 mg/dL — ABNORMAL HIGH (ref 0.76–1.27)
Glucose: 123 mg/dL — ABNORMAL HIGH (ref 70–99)
Potassium: 4.8 mmol/L (ref 3.5–5.2)
Sodium: 142 mmol/L (ref 134–144)
eGFR: 50 mL/min/{1.73_m2} — ABNORMAL LOW (ref >59)

## 2024-01-10 NOTE — Progress Notes (Signed)
 Couple of things: Creatinine has increased from 1.27 in 2023 to 1.46 now.  Very likely, this is progression of underlying mild to moderate kidney dysfunction.  Losartan  can affect kidney function and short-term, but in long-term is actually beneficial for kidney.  Regardless, I would recommend seeing your primary care or nephrologist to establish care.  If you would like me to forward you to nephrology, please let me know.  Cholesterol remains mildly elevated.  For cardiovascular prevention, consider low-dose statin in addition to Mediterranean style heart healthy diet and lifestyle with regular walking.  If you are not sure about starting statin, we could perform coronary calcium score for risk stratification.  Thanks MJP

## 2024-01-13 DIAGNOSIS — M65311 Trigger thumb, right thumb: Secondary | ICD-10-CM | POA: Diagnosis not present

## 2024-01-13 DIAGNOSIS — M1812 Unilateral primary osteoarthritis of first carpometacarpal joint, left hand: Secondary | ICD-10-CM | POA: Diagnosis not present

## 2024-01-13 DIAGNOSIS — M1811 Unilateral primary osteoarthritis of first carpometacarpal joint, right hand: Secondary | ICD-10-CM | POA: Diagnosis not present

## 2024-01-13 NOTE — Progress Notes (Signed)
 Fine with follow up with PCP regarding these.  Thanks MJP

## 2024-01-30 DIAGNOSIS — I10 Essential (primary) hypertension: Secondary | ICD-10-CM | POA: Diagnosis not present

## 2024-01-30 DIAGNOSIS — E039 Hypothyroidism, unspecified: Secondary | ICD-10-CM | POA: Diagnosis not present

## 2024-02-10 ENCOUNTER — Ambulatory Visit (INDEPENDENT_AMBULATORY_CARE_PROVIDER_SITE_OTHER): Payer: Medicare HMO

## 2024-02-10 DIAGNOSIS — I442 Atrioventricular block, complete: Secondary | ICD-10-CM | POA: Diagnosis not present

## 2024-02-10 LAB — CUP PACEART REMOTE DEVICE CHECK
Battery Remaining Longevity: 139 mo
Battery Voltage: 3.03 V
Brady Statistic AP VP Percent: 50.85 %
Brady Statistic AP VS Percent: 0.8 %
Brady Statistic AS VP Percent: 45.24 %
Brady Statistic AS VS Percent: 3.12 %
Brady Statistic RA Percent Paced: 51.56 %
Brady Statistic RV Percent Paced: 96.08 %
Date Time Interrogation Session: 20250609034228
Implantable Lead Connection Status: 753985
Implantable Lead Connection Status: 753985
Implantable Lead Implant Date: 20230829
Implantable Lead Implant Date: 20230829
Implantable Lead Location: 753859
Implantable Lead Location: 753860
Implantable Lead Model: 3830
Implantable Lead Model: 5076
Implantable Pulse Generator Implant Date: 20230829
Lead Channel Impedance Value: 361 Ohm
Lead Channel Impedance Value: 380 Ohm
Lead Channel Impedance Value: 494 Ohm
Lead Channel Impedance Value: 494 Ohm
Lead Channel Pacing Threshold Amplitude: 0.5 V
Lead Channel Pacing Threshold Amplitude: 0.875 V
Lead Channel Pacing Threshold Pulse Width: 0.4 ms
Lead Channel Pacing Threshold Pulse Width: 0.4 ms
Lead Channel Sensing Intrinsic Amplitude: 1.375 mV
Lead Channel Sensing Intrinsic Amplitude: 1.375 mV
Lead Channel Sensing Intrinsic Amplitude: 14.375 mV
Lead Channel Sensing Intrinsic Amplitude: 14.375 mV
Lead Channel Setting Pacing Amplitude: 1.5 V
Lead Channel Setting Pacing Amplitude: 1.5 V
Lead Channel Setting Pacing Pulse Width: 0.4 ms
Lead Channel Setting Sensing Sensitivity: 0.9 mV
Zone Setting Status: 755011
Zone Setting Status: 755011

## 2024-02-15 ENCOUNTER — Ambulatory Visit: Payer: Self-pay | Admitting: Cardiology

## 2024-02-26 ENCOUNTER — Emergency Department (HOSPITAL_BASED_OUTPATIENT_CLINIC_OR_DEPARTMENT_OTHER)
Admission: EM | Admit: 2024-02-26 | Discharge: 2024-02-26 | Disposition: A | Discharge status: Home / Self Care | Attending: Emergency Medicine | Admitting: Emergency Medicine

## 2024-02-26 ENCOUNTER — Other Ambulatory Visit: Payer: Self-pay

## 2024-02-26 DIAGNOSIS — M62838 Other muscle spasm: Secondary | ICD-10-CM | POA: Diagnosis not present

## 2024-02-26 DIAGNOSIS — I10 Essential (primary) hypertension: Secondary | ICD-10-CM | POA: Insufficient documentation

## 2024-02-26 DIAGNOSIS — R109 Unspecified abdominal pain: Secondary | ICD-10-CM | POA: Diagnosis not present

## 2024-02-26 DIAGNOSIS — Z950 Presence of cardiac pacemaker: Secondary | ICD-10-CM | POA: Diagnosis not present

## 2024-02-26 DIAGNOSIS — Z79899 Other long term (current) drug therapy: Secondary | ICD-10-CM | POA: Diagnosis not present

## 2024-02-26 MED ORDER — KETOROLAC TROMETHAMINE 15 MG/ML IJ SOLN
15.0000 mg | Freq: Once | INTRAMUSCULAR | Status: AC
  Administered 2024-02-26: 15 mg via INTRAMUSCULAR
  Filled 2024-02-26: qty 1

## 2024-02-26 MED ORDER — CYCLOBENZAPRINE HCL 10 MG PO TABS: 10.0000 mg | ORAL_TABLET | Freq: Two times a day (BID) | ORAL | 0 refills | Status: AC | PRN

## 2024-02-26 MED ORDER — VALACYCLOVIR HCL 1 G PO TABS: 1000.0000 mg | ORAL_TABLET | Freq: Three times a day (TID) | ORAL | 0 refills | Status: AC

## 2024-02-26 MED ORDER — OXYCODONE-ACETAMINOPHEN 5-325 MG PO TABS: 1.0000 | ORAL_TABLET | Freq: Four times a day (QID) | ORAL | 0 refills | Status: AC | PRN

## 2024-02-26 NOTE — ED Triage Notes (Signed)
 Patient reports right sided flank pain. States he believes he pulled a muscle. Tried robaxin, ibuprofen , and lidocaine  patches without relief.

## 2024-02-26 NOTE — ED Provider Notes (Signed)
 [place] EMERGENCY DEPARTMENT AT [facility] Provider Note   CSN: [id number] Arrival date & time: 02/26/24  9292     Patient presents with: Flank Pain   Cesar Wilson is a 73 y.o. male.   The history is provided by the patient and medical records. No language interpreter was used.  Flank Pain This is a new problem. The current episode started yesterday. The problem occurs constantly. The problem has not changed since onset.Pertinent negatives include no chest pain, no abdominal pain, no headaches and no shortness of breath. Nothing (twisting) aggravates the symptoms. Nothing relieves the symptoms. He has tried nothing for the symptoms. The treatment provided no relief.       Prior to Admission medications   Medication Sig Start Date End Date Taking? Authorizing Provider  amLODipine  (NORVASC ) 10 MG tablet TAKE 1 TABLET BY MOUTH ONCE DAILY(PT MUST KEEP APPT.IN OCTOBER 2024 FOR FUTURE REFILLS) 06/17/23   Patwardhan, Newman PARAS, MD  Ascorbic Acid (VITAMIN C) 1000 MG tablet Take 1,000 mg by mouth daily.    [provider]  Calcium Carbonate-Vitamin D (CALCIUM 600+D PO) Take by mouth.    [provider]  cholecalciferol (VITAMIN D) 1000 UNITS tablet Take 1,000 Units by mouth daily.    [provider]  levothyroxine  (SYNTHROID ) 50 MCG tablet Take 50 mcg by mouth every morning. 03/29/22   [provider]  Loratadine 10 MG CAPS Take by mouth.    [provider]  losartan  (COZAAR ) 100 MG tablet Take 1 tablet by mouth in the evening 10/25/23   Patwardhan, Newman PARAS, MD  metoprolol  succinate (TOPROL -XL) 50 MG 24 hr tablet Take 1 tablet (50 mg total) by mouth daily. Take with or immediately following a meal. 01/09/24   Patwardhan, Newman PARAS, MD  Multiple Vitamins-Minerals (MULTIVITAMINS THER. W/MINERALS) TABS Take 1 tablet by mouth daily.    [provider]  Nutritional Supplements (PROSTATE PO) Take by mouth.    [provider]   omeprazole (PRILOSEC) 20 MG capsule Take 20 mg by mouth daily.    [provider]    Allergies: Sulfa antibiotics, Hydrocodone -acetaminophen , Zithromax [azithromycin], and Percocet [oxycodone -acetaminophen ]    Review of Systems  Constitutional:  Negative for chills, fatigue and fever.  HENT:  Negative for congestion.   Respiratory:  Negative for cough, chest tightness, shortness of breath and wheezing.   Cardiovascular:  Negative for chest pain, palpitations and leg swelling.  Gastrointestinal:  Negative for abdominal pain, diarrhea, nausea and vomiting.  Genitourinary:  Positive for flank pain. Negative for dysuria, frequency, genital sores, penile pain, scrotal swelling and testicular pain.  Musculoskeletal:  Negative for back pain, joint swelling, neck pain and neck stiffness.  Skin:  Negative for rash and wound.  Neurological:  Negative for dizziness, weakness, light-headedness, numbness and headaches.  Psychiatric/Behavioral:  Negative for agitation and confusion.   All other systems reviewed and are negative.   Updated Vital Signs BP (!) 179/88   Pulse 66   Temp 97.7 F (36.5 C) (Oral)   Resp 16   SpO2 96%   Physical Exam Vitals and nursing note reviewed.  Constitutional:      General: He is not in acute distress.    Appearance: He is well-developed. He is not ill-appearing, toxic-appearing or diaphoretic.  HENT:     Head: Normocephalic and atraumatic.     Nose: Nose normal.     Mouth/Throat:     Mouth: Mucous membranes are moist.   Eyes:  Conjunctiva/sclera: Conjunctivae normal.    Cardiovascular:     Rate and Rhythm: Normal rate and regular rhythm.     Heart sounds: No murmur heard. Pulmonary:     Effort: Pulmonary effort is normal. No respiratory distress.     Breath sounds: Normal breath sounds. No wheezing, rhonchi or rales.  Chest:     Chest wall: No tenderness.  Abdominal:     General: Abdomen is flat.     Palpations: Abdomen is soft.      Tenderness: There is no abdominal tenderness. There is no right CVA tenderness, left CVA tenderness, guarding or rebound.   Musculoskeletal:        General: Tenderness present. No swelling.     Cervical back: Neck supple.     Right lower leg: No edema.     Left lower leg: No edema.   Skin:    General: Skin is warm and dry.     Capillary Refill: Capillary refill takes less than 2 seconds.     Findings: No erythema or rash.   Neurological:     General: No focal deficit present.     Mental Status: He is alert.     Sensory: No sensory deficit.     Motor: No weakness.   Psychiatric:        Mood and Affect: Mood normal.     (all labs ordered are listed, but only abnormal results are displayed) Labs Reviewed - No data to display  EKG: None  Radiology: No results found.   Procedures   Medications Ordered in the ED  ketorolac  (TORADOL ) 15 MG/ML injection 15 mg (15 mg Intramuscular Given 02/26/24 0755)                                    Medical Decision Making  Cesar Wilson is a 73 y.o. male with a past medical history significant for hypertension, GERD, anxiety, and previous heart block with pacemaker who presents with right flank pain.  According to patient, this afternoon he just felt pain in his right side after he thinks he was twisting and reaching for a backpack.  He reports no impact trauma and reports no other symptoms.  He denies any skin changes rash or lacerations.  He denies any chest pain or shortness of breath.  Denies any abdominal pain or central back pain.  He denies any leg pain or leg swelling.  Denies any loss of bowel or bladder control, denies any hematuria dysuria or urinary symptoms.  Denies send he constipation, diarrhea, or other complaints.  Denies any malaise or fatigue.  Patient reports taking some old Robaxin from his wife, some ibuprofen , and lidocaine  patches without relief.  He thinks it is a muscle spasm and wants something different help with  the pain.  On exam, he does have some mild tenderness in his right lateral flank but did not have any abdominal tenderness whatsoever noted to have any midline back tenderness.  No rash seen to suggest shingles at this time and the rest of exam was reassuring.  Patient has no other complaints.  We had a long shared decision-making conversation about management.  We offered to get urinalysis, labs, and even CT imaging to rule out kidney stone, early appendicitis, cholecystitis, or other acute cause of his symptoms.  Patient says he would rather get prescription for medications to try and if this does not improve he  would return for more extensive workup.  He also was requesting a Toradol  shot which she reports has helped him in the past for muscle pains.  We discussed that we likely to get labs to make sure his kidney function and other electrolytes are okay but he would prefer to get it without getting other workup at this time.  Previous GFR was reassuring.  We continue to have discussion and will give him a different muscle relaxant he will avoid the Robaxin, will give a prescription for some pain medicine, and will also print a prescription for Valtrex in case he develops a rash as his location of pain could be consistent with the pain before the rash was shingles.  Patient agrees with plan of care and understands this plan.  He still would like to have minimal workup and try the medications and he will return for further workup and returns.  He understands this and had no other questions or concerns.  Patient discharged in good condition.      Final diagnoses:  Acute right flank pain  Muscle spasm    ED Discharge Orders          Ordered    cyclobenzaprine (FLEXERIL) 10 MG tablet  2 times daily PRN        02/26/24 0751    oxyCODONE -acetaminophen  (PERCOCET/ROXICET) 5-325 MG tablet  Every 6 hours PRN        02/26/24 0751    valACYclovir (VALTREX) 1000 MG tablet  3 times daily         02/26/24 0752           Clinical Impression: 1. Acute right flank pain   2. Muscle spasm     Disposition: Discharge  Condition: Good  I have discussed the results, Dx and Tx plan with the pt(& family if present). He/she/they expressed understanding and agree(s) with the plan. Discharge instructions discussed at great length. Strict return precautions discussed and pt &/or family have verbalized understanding of the instructions. No further questions at time of discharge.    New Prescriptions   CYCLOBENZAPRINE (FLEXERIL) 10 MG TABLET    Take 1 tablet (10 mg total) by mouth 2 (two) times daily as needed for muscle spasms.   OXYCODONE -ACETAMINOPHEN  (PERCOCET/ROXICET) 5-325 MG TABLET    Take 1 tablet by mouth every 6 (six) hours as needed for severe pain (pain score 7-10).   VALACYCLOVIR (VALTREX) 1000 MG TABLET    Take 1 tablet (1,000 mg total) by mouth 3 (three) times daily for 7 days.    Follow Up: Redmon, Noelle, PA [address] [place] [address] [phone]     [facility] Emergency Department at [facility] [address] [place]  [address] [phone]        Daneisha Surges, Lonni PARAS, MD 02/26/24 [phone]

## 2024-02-26 NOTE — Discharge Instructions (Signed)
 Your history, and exam today are consistent with likely muscle spasm and muscle skeletal pain on your right flank.  We did have a shared decision-making conversation offering urinalysis, labs, CT imaging, and further workup to rule out concerning etiologies such as kidney stone, infection, appendicitis, cholecystitis, and other causes however you would prefer to try some medication and see how they do and if symptoms not improved, will return for further workup.  We also discussed the possibly of this being the pain before the rash was shingles we will print you the prescription for Valtrex to take if the rash develops.  You requested a dose of Toradol  about which we discussed and agreed with giving you today.  Please use the medications and rest and stay hydrated and follow-up with your primary doctor.  If any symptoms change or worsen acutely, return to the nearest emergency department.

## 2024-03-03 ENCOUNTER — Other Ambulatory Visit: Payer: Self-pay | Admitting: Cardiology

## 2024-03-03 DIAGNOSIS — I10 Essential (primary) hypertension: Secondary | ICD-10-CM

## 2024-03-06 DIAGNOSIS — J309 Allergic rhinitis, unspecified: Secondary | ICD-10-CM | POA: Diagnosis not present

## 2024-03-08 DIAGNOSIS — J0100 Acute maxillary sinusitis, unspecified: Secondary | ICD-10-CM | POA: Diagnosis not present

## 2024-03-24 NOTE — Progress Notes (Signed)
 Remote pacemaker transmission.

## 2024-05-05 DIAGNOSIS — J0100 Acute maxillary sinusitis, unspecified: Secondary | ICD-10-CM | POA: Diagnosis not present

## 2024-05-05 DIAGNOSIS — R051 Acute cough: Secondary | ICD-10-CM | POA: Diagnosis not present

## 2024-05-11 ENCOUNTER — Ambulatory Visit: Payer: Medicare HMO

## 2024-05-11 DIAGNOSIS — I442 Atrioventricular block, complete: Secondary | ICD-10-CM

## 2024-05-12 LAB — CUP PACEART REMOTE DEVICE CHECK
Battery Remaining Longevity: 135 mo
Battery Voltage: 3.03 V
Brady Statistic AP VP Percent: 71.73 %
Brady Statistic AP VS Percent: 0.85 %
Brady Statistic AS VP Percent: 25.94 %
Brady Statistic AS VS Percent: 1.47 %
Brady Statistic RA Percent Paced: 72.53 %
Brady Statistic RV Percent Paced: 97.68 %
Date Time Interrogation Session: 20250907225559
Implantable Lead Connection Status: 753985
Implantable Lead Connection Status: 753985
Implantable Lead Implant Date: 20230829
Implantable Lead Implant Date: 20230829
Implantable Lead Location: 753859
Implantable Lead Location: 753860
Implantable Lead Model: 3830
Implantable Lead Model: 5076
Implantable Pulse Generator Implant Date: 20230829
Lead Channel Impedance Value: 361 Ohm
Lead Channel Impedance Value: 380 Ohm
Lead Channel Impedance Value: 475 Ohm
Lead Channel Impedance Value: 475 Ohm
Lead Channel Pacing Threshold Amplitude: 0.5 V
Lead Channel Pacing Threshold Amplitude: 1 V
Lead Channel Pacing Threshold Pulse Width: 0.4 ms
Lead Channel Pacing Threshold Pulse Width: 0.4 ms
Lead Channel Sensing Intrinsic Amplitude: 1.375 mV
Lead Channel Sensing Intrinsic Amplitude: 1.375 mV
Lead Channel Sensing Intrinsic Amplitude: 15.25 mV
Lead Channel Sensing Intrinsic Amplitude: 15.25 mV
Lead Channel Setting Pacing Amplitude: 1.5 V
Lead Channel Setting Pacing Amplitude: 1.5 V
Lead Channel Setting Pacing Pulse Width: 0.4 ms
Lead Channel Setting Sensing Sensitivity: 0.9 mV
Zone Setting Status: 755011
Zone Setting Status: 755011

## 2024-05-15 ENCOUNTER — Ambulatory Visit: Payer: Self-pay | Admitting: Cardiology

## 2024-05-21 NOTE — Progress Notes (Signed)
 Remote PPM Transmission

## 2024-06-06 ENCOUNTER — Other Ambulatory Visit: Payer: Self-pay | Admitting: Cardiology

## 2024-06-06 DIAGNOSIS — I10 Essential (primary) hypertension: Secondary | ICD-10-CM

## 2024-07-09 DIAGNOSIS — M65311 Trigger thumb, right thumb: Secondary | ICD-10-CM | POA: Diagnosis not present

## 2024-08-06 DIAGNOSIS — R972 Elevated prostate specific antigen [PSA]: Secondary | ICD-10-CM | POA: Diagnosis not present

## 2024-08-10 ENCOUNTER — Ambulatory Visit: Payer: Medicare HMO

## 2024-08-10 DIAGNOSIS — I442 Atrioventricular block, complete: Secondary | ICD-10-CM | POA: Diagnosis not present

## 2024-08-11 LAB — CUP PACEART REMOTE DEVICE CHECK
Battery Remaining Longevity: 132 mo
Battery Voltage: 3.03 V
Brady Statistic AP VP Percent: 72.58 %
Brady Statistic AP VS Percent: 0.74 %
Brady Statistic AS VP Percent: 25.2 %
Brady Statistic AS VS Percent: 1.48 %
Brady Statistic RA Percent Paced: 73.3 %
Brady Statistic RV Percent Paced: 97.78 %
Date Time Interrogation Session: 20251207224805
Implantable Lead Connection Status: 753985
Implantable Lead Connection Status: 753985
Implantable Lead Implant Date: 20230829
Implantable Lead Implant Date: 20230829
Implantable Lead Location: 753859
Implantable Lead Location: 753860
Implantable Lead Model: 3830
Implantable Lead Model: 5076
Implantable Pulse Generator Implant Date: 20230829
Lead Channel Impedance Value: 361 Ohm
Lead Channel Impedance Value: 380 Ohm
Lead Channel Impedance Value: 475 Ohm
Lead Channel Impedance Value: 494 Ohm
Lead Channel Pacing Threshold Amplitude: 0.5 V
Lead Channel Pacing Threshold Amplitude: 0.875 V
Lead Channel Pacing Threshold Pulse Width: 0.4 ms
Lead Channel Pacing Threshold Pulse Width: 0.4 ms
Lead Channel Sensing Intrinsic Amplitude: 1.375 mV
Lead Channel Sensing Intrinsic Amplitude: 1.375 mV
Lead Channel Sensing Intrinsic Amplitude: 18.75 mV
Lead Channel Sensing Intrinsic Amplitude: 18.75 mV
Lead Channel Setting Pacing Amplitude: 1.5 V
Lead Channel Setting Pacing Amplitude: 1.5 V
Lead Channel Setting Pacing Pulse Width: 0.4 ms
Lead Channel Setting Sensing Sensitivity: 0.9 mV
Zone Setting Status: 755011
Zone Setting Status: 755011

## 2024-08-13 DIAGNOSIS — N400 Enlarged prostate without lower urinary tract symptoms: Secondary | ICD-10-CM | POA: Diagnosis not present

## 2024-08-13 DIAGNOSIS — R972 Elevated prostate specific antigen [PSA]: Secondary | ICD-10-CM | POA: Diagnosis not present

## 2024-08-18 NOTE — Progress Notes (Signed)
 Remote PPM Transmission

## 2024-08-19 ENCOUNTER — Other Ambulatory Visit (HOSPITAL_COMMUNITY): Payer: Self-pay | Admitting: Urology

## 2024-08-19 DIAGNOSIS — R972 Elevated prostate specific antigen [PSA]: Secondary | ICD-10-CM

## 2024-08-21 ENCOUNTER — Ambulatory Visit: Payer: Self-pay | Admitting: Cardiovascular Disease

## 2024-09-24 NOTE — CV Procedure (Signed)
" °  Device system confirmed to be MRI conditional, with implant date > 6 weeks ago, and no evidence of abandoned or epicardial leads in review of most recent CXR  Device last cleared by EP Provider: Daphne Barrack 09/24/2024  Clearance is good through for 1 year as long as parameters remain stable at time of check. If pt undergoes a cardiac device procedure during that time, they should be re-cleared.   Tachy-therapies to be programmed off if applicable with device back to pre-MRI settings after completion of exam.  Medtr[employer] - Programming recommendation received through Medtronic App/Tablet  Rocky Catalan, RT  09/24/2024 4:13 PM     "

## 2024-10-01 ENCOUNTER — Ambulatory Visit (HOSPITAL_COMMUNITY)
Admission: RE | Admit: 2024-10-01 | Discharge: 2024-10-01 | Disposition: A | Discharge status: Home / Self Care | Source: Ambulatory Visit | Attending: Urology | Admitting: Urology

## 2024-10-01 DIAGNOSIS — R972 Elevated prostate specific antigen [PSA]: Secondary | ICD-10-CM | POA: Diagnosis present

## 2024-10-01 MED ORDER — GADOBUTROL 1 MMOL/ML IV SOLN
9.0000 mL | Freq: Once | INTRAVENOUS | Status: AC | PRN
  Administered 2024-10-01: 9 mL via INTRAVENOUS

## 2024-10-15 ENCOUNTER — Ambulatory Visit: Admitting: Student

## 2024-11-09 ENCOUNTER — Ambulatory Visit: Payer: Medicare HMO

## 2025-02-08 ENCOUNTER — Ambulatory Visit

## 2025-05-11 ENCOUNTER — Ambulatory Visit

## 2025-08-10 ENCOUNTER — Ambulatory Visit

## 2025-11-09 ENCOUNTER — Ambulatory Visit

## 2026-02-08 ENCOUNTER — Ambulatory Visit
